# Patient Record
Sex: Female | Born: 1990 | Race: White | Hispanic: No | Smoking: Former smoker
Health system: Southern US, Community
[De-identification: ages and names within clinical notes are randomized; demographics above are authoritative.]

## PROBLEM LIST (undated history)

## (undated) DIAGNOSIS — F909 Attention-deficit hyperactivity disorder, unspecified type: Secondary | ICD-10-CM

## (undated) DIAGNOSIS — R480 Dyslexia and alexia: Secondary | ICD-10-CM

## (undated) DIAGNOSIS — J45909 Unspecified asthma, uncomplicated: Secondary | ICD-10-CM

## (undated) DIAGNOSIS — F419 Anxiety disorder, unspecified: Secondary | ICD-10-CM

## (undated) DIAGNOSIS — J309 Allergic rhinitis, unspecified: Secondary | ICD-10-CM

## (undated) DIAGNOSIS — F339 Major depressive disorder, recurrent, unspecified: Secondary | ICD-10-CM

## (undated) HISTORY — DX: Allergic rhinitis, unspecified: J30.9

## (undated) HISTORY — DX: Major depressive disorder, recurrent, unspecified: F33.9

## (undated) HISTORY — DX: Anxiety disorder, unspecified: F41.9

## (undated) HISTORY — DX: Dyslexia and alexia: R48.0

## (undated) HISTORY — PX: NASAL SINUS SURGERY: SHX719

## (undated) HISTORY — PX: TONSILLECTOMY AND ADENOIDECTOMY: SHX28

## (undated) HISTORY — DX: Attention-deficit hyperactivity disorder, unspecified type: F90.9

---

## 2016-11-08 LAB — CYTOLOGY - PAP: PAP SMEAR: NEGATIVE

## 2017-08-18 ENCOUNTER — Emergency Department (HOSPITAL_COMMUNITY)
Admission: EM | Admit: 2017-08-18 | Discharge: 2017-08-18 | Disposition: A | Discharge status: Home / Self Care | Payer: 59 | Attending: Emergency Medicine | Admitting: Emergency Medicine

## 2017-08-18 ENCOUNTER — Other Ambulatory Visit: Payer: Self-pay

## 2017-08-18 ENCOUNTER — Encounter (HOSPITAL_COMMUNITY): Payer: Self-pay | Admitting: Emergency Medicine

## 2017-08-18 DIAGNOSIS — R309 Painful micturition, unspecified: Secondary | ICD-10-CM | POA: Insufficient documentation

## 2017-08-18 DIAGNOSIS — J45909 Unspecified asthma, uncomplicated: Secondary | ICD-10-CM | POA: Insufficient documentation

## 2017-08-18 DIAGNOSIS — R350 Frequency of micturition: Secondary | ICD-10-CM | POA: Diagnosis present

## 2017-08-18 DIAGNOSIS — R1030 Lower abdominal pain, unspecified: Secondary | ICD-10-CM | POA: Diagnosis not present

## 2017-08-18 DIAGNOSIS — N3000 Acute cystitis without hematuria: Secondary | ICD-10-CM | POA: Diagnosis not present

## 2017-08-18 HISTORY — DX: Unspecified asthma, uncomplicated: J45.909

## 2017-08-18 LAB — URINALYSIS, ROUTINE W REFLEX MICROSCOPIC
Bilirubin Urine: NEGATIVE
GLUCOSE, UA: NEGATIVE mg/dL
KETONES UR: NEGATIVE mg/dL
Nitrite: POSITIVE — AB
PROTEIN: 30 mg/dL — AB
Specific Gravity, Urine: 1.023 (ref 1.005–1.030)
pH: 6 (ref 5.0–8.0)

## 2017-08-18 LAB — PREGNANCY, URINE: PREG TEST UR: NEGATIVE

## 2017-08-18 MED ORDER — CIPROFLOXACIN HCL 500 MG PO TABS
500.0000 mg | ORAL_TABLET | Freq: Once | ORAL | Status: AC
  Administered 2017-08-18: 500 mg via ORAL
  Filled 2017-08-18: qty 1

## 2017-08-18 MED ORDER — PHENAZOPYRIDINE HCL 200 MG PO TABS: 200.0000 mg | ORAL_TABLET | Freq: Three times a day (TID) | ORAL | 0 refills | Status: DC | PRN

## 2017-08-18 MED ORDER — CIPROFLOXACIN HCL 500 MG PO TABS: 500.0000 mg | ORAL_TABLET | Freq: Two times a day (BID) | ORAL | 0 refills | Status: DC

## 2017-08-18 NOTE — ED Provider Notes (Signed)
[facility]-EMERGENCY DEPT Provider Note   CSN: [id number] Arrival date & time: 08/18/17  0114     History   Chief Complaint Chief Complaint  Patient presents with  . Urinary Frequency    HPI Christy Willis is a 27 y.o. female.  Patient is a 27 year old female with no significant past medical history presenting with complaints of burning with urination and suprapubic discomfort for the past several days.  She believes she may have a UTI.  She has had these in the past and this feels the same.  She denies any fevers or chills.  She denies any vomiting.   The history is provided by the patient.  Urinary Frequency  This is a new problem. Episode onset: 3 days ago. The problem occurs constantly. The problem has been gradually worsening. Associated symptoms include abdominal pain. Exacerbated by: Urinating. Nothing relieves the symptoms. Treatments tried: Ibuprofen. The treatment provided mild relief.    Past Medical History:  Diagnosis Date  . Asthma     There are no active problems to display for this patient.   Past Surgical History:  Procedure Laterality Date  . NASAL SINUS SURGERY    . TONSILLECTOMY AND ADENOIDECTOMY      OB History    No data available       Home Medications    Prior to Admission medications   Not on File    Family History Family History  Adopted: Yes    Social History Social History   Tobacco Use  . Smoking status: Never Smoker  . Smokeless tobacco: Never Used  Substance Use Topics  . Alcohol use: No    Frequency: Never  . Drug use: No     Allergies   Patient has no known allergies.   Review of Systems Review of Systems  Gastrointestinal: Positive for abdominal pain.  Genitourinary: Positive for frequency.  All other systems reviewed and are negative.    Physical Exam Updated Vital Signs BP 123/77 (BP Location: Left Arm)   Pulse 82   Temp 98 F (36.7 C) (Oral)   Resp 18   LMP 08/14/2017  (Exact Date)   SpO2 96%   Physical Exam  Constitutional: She is oriented to person, place, and time. She appears well-developed and well-nourished. No distress.  HENT:  Head: Normocephalic and atraumatic.  Neck: Normal range of motion. Neck supple.  Cardiovascular: Normal rate and regular rhythm. Exam reveals no gallop and no friction rub.  No murmur heard. Pulmonary/Chest: Effort normal and breath sounds normal. No respiratory distress. She has no wheezes.  Abdominal: Soft. Bowel sounds are normal. She exhibits no distension. There is tenderness.  There is mild suprapubic tenderness.  There is no rebound or guarding.  Musculoskeletal: Normal range of motion.  Neurological: She is alert and oriented to person, place, and time.  Skin: Skin is warm and dry. She is not diaphoretic.  Nursing note and vitals reviewed.    ED Treatments / Results  Labs (all labs ordered are listed, but only abnormal results are displayed) Labs Reviewed  URINALYSIS, ROUTINE W REFLEX MICROSCOPIC - Abnormal; Notable for the following components:      Result Value   APPearance CLOUDY (*)    Hgb urine dipstick SMALL (*)    Protein, ur 30 (*)    Nitrite POSITIVE (*)    Leukocytes, UA LARGE (*)    Bacteria, UA MANY (*)    Squamous Epithelial / LPF 6-30 (*)    Non Squamous  Epithelial 0-5 (*)    All other components within normal limits  PREGNANCY, URINE    EKG  EKG Interpretation None       Radiology No results found.  Procedures Procedures (including critical care time)  Medications Ordered in ED Medications  ciprofloxacin (CIPRO) tablet 500 mg (not administered)     Initial Impression / Assessment and Plan / ED Course  I have reviewed the triage vital signs and the nursing notes.  Pertinent labs & imaging results that were available during my care of the patient were reviewed by me and considered in my medical decision making (see chart for details).  Patient presents with urinary  symptoms and a urinalysis that is consistent with a UTI.  She will be treated with Cipro, Pyridium, and follow-up as needed if not improving.  She has no fever and vitals are stable.  She does not appear toxic.  Final Clinical Impressions(s) / ED Diagnoses   Final diagnoses:  None    ED Discharge Orders    None       Geoffery Lyonselo, Takeysha Bonk, MD 08/18/17 [phone]

## 2017-08-18 NOTE — ED Triage Notes (Signed)
Pt states she thinks she has a UTI  Pt states she has urinary frequency, lower abd and back pain, nausea, cloudy urine, and decreased appetite  Pt states she is not able to provide a urine specimen at this time

## 2017-08-18 NOTE — Discharge Instructions (Signed)
Cipro as prescribed.  Pyridium as prescribed.  Ibuprofen 600 mg rotated with Tylenol 1000 mg every 4 hours as needed for pain.  Return to the emergency department for high fever, vomiting, severe pain, or other new and concerning symptoms.

## 2017-09-23 ENCOUNTER — Encounter (HOSPITAL_COMMUNITY): Payer: Self-pay | Admitting: Emergency Medicine

## 2017-09-23 ENCOUNTER — Other Ambulatory Visit: Payer: Self-pay

## 2017-09-23 ENCOUNTER — Encounter (HOSPITAL_BASED_OUTPATIENT_CLINIC_OR_DEPARTMENT_OTHER): Payer: Self-pay | Admitting: Emergency Medicine

## 2017-09-23 ENCOUNTER — Emergency Department (HOSPITAL_COMMUNITY)
Admission: EM | Admit: 2017-09-23 | Discharge: 2017-09-23 | Disposition: A | Discharge status: Home / Self Care | Payer: 59 | Source: Home / Self Care

## 2017-09-23 ENCOUNTER — Emergency Department (HOSPITAL_BASED_OUTPATIENT_CLINIC_OR_DEPARTMENT_OTHER)
Admission: EM | Admit: 2017-09-23 | Discharge: 2017-09-23 | Disposition: A | Discharge status: Home / Self Care | Payer: 59 | Attending: Emergency Medicine | Admitting: Emergency Medicine

## 2017-09-23 DIAGNOSIS — Z79899 Other long term (current) drug therapy: Secondary | ICD-10-CM | POA: Diagnosis not present

## 2017-09-23 DIAGNOSIS — N898 Other specified noninflammatory disorders of vagina: Secondary | ICD-10-CM | POA: Diagnosis not present

## 2017-09-23 DIAGNOSIS — A6004 Herpesviral vulvovaginitis: Secondary | ICD-10-CM | POA: Diagnosis not present

## 2017-09-23 DIAGNOSIS — R509 Fever, unspecified: Secondary | ICD-10-CM

## 2017-09-23 DIAGNOSIS — J45909 Unspecified asthma, uncomplicated: Secondary | ICD-10-CM | POA: Diagnosis not present

## 2017-09-23 DIAGNOSIS — R300 Dysuria: Secondary | ICD-10-CM | POA: Diagnosis present

## 2017-09-23 DIAGNOSIS — Z5321 Procedure and treatment not carried out due to patient leaving prior to being seen by health care provider: Secondary | ICD-10-CM

## 2017-09-23 LAB — URINALYSIS, COMPLETE (UACMP) WITH MICROSCOPIC
Bilirubin Urine: NEGATIVE
Glucose, UA: NEGATIVE mg/dL
KETONES UR: NEGATIVE mg/dL
Nitrite: POSITIVE — AB
PROTEIN: 30 mg/dL — AB
Specific Gravity, Urine: 1.015 (ref 1.005–1.030)
pH: 6 (ref 5.0–8.0)

## 2017-09-23 LAB — WET PREP, GENITAL
CLUE CELLS WET PREP: NONE SEEN
Sperm: NONE SEEN
TRICH WET PREP: NONE SEEN
YEAST WET PREP: NONE SEEN

## 2017-09-23 LAB — PREGNANCY, URINE: Preg Test, Ur: NEGATIVE

## 2017-09-23 MED ORDER — IBUPROFEN 800 MG PO TABS
800.0000 mg | ORAL_TABLET | Freq: Once | ORAL | Status: AC
  Administered 2017-09-23: 800 mg via ORAL
  Filled 2017-09-23: qty 1

## 2017-09-23 MED ORDER — VALACYCLOVIR HCL 1 G PO TABS: 1000.0000 mg | ORAL_TABLET | Freq: Two times a day (BID) | ORAL | 0 refills | Status: AC

## 2017-09-23 MED ORDER — AZITHROMYCIN 250 MG PO TABS
1000.0000 mg | ORAL_TABLET | Freq: Once | ORAL | Status: AC
Start: 2017-09-23 — End: 2017-09-23
  Administered 2017-09-23: 1000 mg via ORAL
  Filled 2017-09-23: qty 4

## 2017-09-23 MED ORDER — IBUPROFEN 800 MG PO TABS: 800.0000 mg | ORAL_TABLET | Freq: Three times a day (TID) | ORAL | 0 refills | Status: DC

## 2017-09-23 MED ORDER — METRONIDAZOLE 500 MG PO TABS: 500.0000 mg | ORAL_TABLET | Freq: Two times a day (BID) | ORAL | 0 refills | Status: DC

## 2017-09-23 MED ORDER — CEFTRIAXONE SODIUM 250 MG IJ SOLR
250.0000 mg | Freq: Once | INTRAMUSCULAR | Status: AC
  Administered 2017-09-23: 250 mg via INTRAMUSCULAR
  Filled 2017-09-23: qty 250

## 2017-09-23 MED ORDER — TRAMADOL HCL 50 MG PO TABS: 100.0000 mg | ORAL_TABLET | Freq: Four times a day (QID) | ORAL | 0 refills | Status: DC | PRN

## 2017-09-23 NOTE — ED Provider Notes (Signed)
[facility] EMERGENCY DEPARTMENT Provider Note   CSN: [id number] Arrival date & time: 09/23/17  4098     History   Chief Complaint Chief Complaint  Patient presents with  . Dysuria  . Vaginal Discharge    HPI Christy Willis is a 27 y.o. female.  HPI Patient reports burning with urination starting 3 days ago.  She first noted vaginal burning and discomfort and thought she might have yeast infection.  She tried Diflucan without any improvement.  Patient reports she took antibiotics about a month ago for urinary tract infection.  She reports since then she has had increased swelling and vaginal irritation and burning.  He denies any significant amount of abdominal pain.  She reports last night she felt that she had a fever.  Patient is sexually active.  She has been with this partner for 2 months.  She denies history of STD.  No other medical history except controlled asthma. Past Medical History:  Diagnosis Date  . Asthma     There are no active problems to display for this patient.   Past Surgical History:  Procedure Laterality Date  . NASAL SINUS SURGERY    . TONSILLECTOMY AND ADENOIDECTOMY      OB History    No data available       Home Medications    Prior to Admission medications   Medication Sig Start Date End Date Taking? Authorizing Provider  propantheline (PROBANTHINE) 15 MG tablet Take 15 mg by mouth 3 (three) times daily with meals.   Yes [provider]  QUEtiapine (SEROQUEL) 100 MG tablet Take 100 mg by mouth at bedtime.   Yes [provider]  sertraline (ZOLOFT) 100 MG tablet Take 100 mg by mouth daily.   Yes [provider]  ciprofloxacin (CIPRO) 500 MG tablet Take 1 tablet (500 mg total) by mouth 2 (two) times daily. One po bid x 7 days 08/18/17   Geoffery Lyons, MD  ibuprofen (ADVIL,MOTRIN) 800 MG tablet Take 1 tablet (800 mg total) by mouth 3 (three) times daily. 09/23/17   Arby Barrette, MD  metroNIDAZOLE (FLAGYL)  500 MG tablet Take 1 tablet (500 mg total) by mouth 2 (two) times daily. One po bid x 7 days 09/23/17   Arby Barrette, MD  phenazopyridine (PYRIDIUM) 200 MG tablet Take 1 tablet (200 mg total) by mouth 3 (three) times daily as needed for pain. 08/18/17   Geoffery Lyons, MD  traMADol (ULTRAM) 50 MG tablet Take 2 tablets (100 mg total) by mouth every 6 (six) hours as needed. 09/23/17   Arby Barrette, MD  valACYclovir (VALTREX) 1000 MG tablet Take 1 tablet (1,000 mg total) by mouth 2 (two) times daily for 10 days. 09/23/17 10/03/17  Arby Barrette, MD    Family History Family History  Adopted: Yes    Social History Social History   Tobacco Use  . Smoking status: Never Smoker  . Smokeless tobacco: Never Used  Substance Use Topics  . Alcohol use: No    Frequency: Never  . Drug use: No     Allergies   Patient has no known allergies.   Review of Systems Review of Systems 10 Systems reviewed and are negative for acute change except as noted in the HPI.  Physical Exam Updated Vital Signs BP 104/63 (BP Location: Left Arm)   Pulse 92   Temp 98.8 F (37.1 C) (Oral)   Resp 18   Ht 5\' 4"  (1.626 m)   Wt 57.6 kg (127  lb)   LMP 09/02/2017   SpO2 98%   BMI 21.80 kg/m   Physical Exam  Constitutional: She is oriented to person, place, and time. She appears well-developed and well-nourished. No distress.  HENT:  Head: Normocephalic and atraumatic.  Eyes: Conjunctivae and EOM are normal.  Cardiovascular: Normal rate, regular rhythm and normal heart sounds.  Pulmonary/Chest: Effort normal and breath sounds normal.  Abdominal: Soft.  Mild suprapubic discomfort to palpation.  She reports pressure sensation.  Moderate increased discomfort left lower/mid abdomen.  No guarding, no palpable mass.  Genitourinary:  Genitourinary Comments: Shallow erosive lesions on inner mucosal surfaces of labia minora.  These are exquisitely tender.  Speculum exam shows copious yellow discharge in the  vaginal vault and friability of the cervix.  Bimanual exam, cervix tender but adnexa nontender.  Musculoskeletal: Normal range of motion. She exhibits no edema or tenderness.  No joint effusions.  Neurological: She is alert and oriented to person, place, and time. No cranial nerve deficit. She exhibits normal muscle tone. Coordination normal.  Skin: Skin is warm and dry.  Psychiatric: She has a normal mood and affect.     ED Treatments / Results  Labs (all labs ordered are listed, but only abnormal results are displayed) Labs Reviewed  URINALYSIS, COMPLETE (UACMP) WITH MICROSCOPIC - Abnormal; Notable for the following components:      Result Value   APPearance CLOUDY (*)    Hgb urine dipstick MODERATE (*)    Protein, ur 30 (*)    Nitrite POSITIVE (*)    Leukocytes, UA LARGE (*)    Squamous Epithelial / LPF TOO NUMEROUS TO COUNT (*)    Bacteria, UA MANY (*)    All other components within normal limits  WET PREP, GENITAL  HSV CULTURE AND TYPING  PREGNANCY, URINE  RPR  HIV ANTIBODY (ROUTINE TESTING)  GC/CHLAMYDIA PROBE AMP ([place]) NOT AT [facility]    EKG  EKG Interpretation None       Radiology No results found.  Procedures Procedures (including critical care time)  Medications Ordered in ED Medications  cefTRIAXone (ROCEPHIN) injection 250 mg (not administered)  azithromycin (ZITHROMAX) tablet 1,000 mg (not administered)  ibuprofen (ADVIL,MOTRIN) tablet 800 mg (not administered)     Initial Impression / Assessment and Plan / ED Course  I have reviewed the triage vital signs and the nursing notes.  Pertinent labs & imaging results that were available during my care of the patient were reviewed by me and considered in my medical decision making (see chart for details).      Final Clinical Impressions(s) / ED Diagnoses   Final diagnoses:  Herpes simplex vulvovaginitis  Vaginal discharge   Patient presents with new onset of vaginal lesions consistent with  herpes simplex.  Also she has had vaginal discharge.  Will treat for broad coverage of STDs.  Patient will be started on Valtrex.  She is counseled on necessity of follow-up with gynecology.  Information given for women's clinic.  She is counseled on avoiding any sexual contact until complete treatment and treatment of partners. ED Discharge Orders        Ordered    valACYclovir (VALTREX) 1000 MG tablet  2 times daily     09/23/17 0945    metroNIDAZOLE (FLAGYL) 500 MG tablet  2 times daily     09/23/17 0945    ibuprofen (ADVIL,MOTRIN) 800 MG tablet  3 times daily     09/23/17 0945    traMADol (ULTRAM) 50 MG tablet  Every 6 hours PRN     09/23/17 0945       Arby BarrettePfeiffer, Mazal Ebey, MD 09/23/17 [phone]

## 2017-09-23 NOTE — ED Notes (Signed)
Bladder scan showed 18ml

## 2017-09-23 NOTE — ED Triage Notes (Signed)
Patient reports since last night not able to pee.  Tried taking Diflucan because has at home and not helped.  Patient also states that she has vaginal pain and very sensitive. Patient reports that she has fever last night and took some medicatins around 530am today.

## 2017-09-23 NOTE — Discharge Instructions (Signed)
1.  Make an appointment to follow-up with a gynecologist.  You may contact a gynecologist of your choice or [facility]. 2.  You must avoid all sexual contact until you have completed treatment and any partners have been treated.  Repeat exposure to infected partners will result in reinfection.

## 2017-09-23 NOTE — ED Triage Notes (Signed)
Dysuria, vaginal discharge, vaginal soreness x 2 days.

## 2017-09-24 LAB — RPR: RPR Ser Ql: NONREACTIVE

## 2017-09-24 LAB — HIV ANTIBODY (ROUTINE TESTING W REFLEX): HIV Screen 4th Generation wRfx: NONREACTIVE

## 2017-09-25 LAB — HSV CULTURE AND TYPING

## 2017-09-25 LAB — GC/CHLAMYDIA PROBE AMP (~~LOC~~) NOT AT ARMC
CHLAMYDIA, DNA PROBE: NEGATIVE
NEISSERIA GONORRHEA: NEGATIVE

## 2017-10-03 ENCOUNTER — Encounter: Payer: Self-pay | Admitting: Obstetrics and Gynecology

## 2017-10-03 ENCOUNTER — Ambulatory Visit (INDEPENDENT_AMBULATORY_CARE_PROVIDER_SITE_OTHER): Payer: 59 | Admitting: Obstetrics and Gynecology

## 2017-10-03 ENCOUNTER — Ambulatory Visit: Payer: 59 | Admitting: Clinical

## 2017-10-03 VITALS — BP 132/88 | HR 88 | Ht 64.0 in | Wt 135.7 lb

## 2017-10-03 DIAGNOSIS — F4323 Adjustment disorder with mixed anxiety and depressed mood: Secondary | ICD-10-CM

## 2017-10-03 DIAGNOSIS — F329 Major depressive disorder, single episode, unspecified: Secondary | ICD-10-CM

## 2017-10-03 DIAGNOSIS — G2581 Restless legs syndrome: Secondary | ICD-10-CM

## 2017-10-03 DIAGNOSIS — R4589 Other symptoms and signs involving emotional state: Secondary | ICD-10-CM

## 2017-10-03 DIAGNOSIS — B009 Herpesviral infection, unspecified: Secondary | ICD-10-CM

## 2017-10-03 DIAGNOSIS — Z09 Encounter for follow-up examination after completed treatment for conditions other than malignant neoplasm: Secondary | ICD-10-CM

## 2017-10-03 MED ORDER — GABAPENTIN 300 MG PO CAPS: 300.0000 mg | ORAL_CAPSULE | Freq: Every day | ORAL | 0 refills | Status: DC

## 2017-10-03 MED ORDER — VALACYCLOVIR HCL 500 MG PO TABS: 500.0000 mg | ORAL_TABLET | Freq: Two times a day (BID) | ORAL | 3 refills | Status: AC

## 2017-10-03 NOTE — BH Specialist Note (Signed)
Integrated Behavioral Health Initial Visit  Seen by Alfredo BachAllison Workman, MSW Intern MRN: [id number] Name: Christy PileAnna Willis  Number of Integrated Behavioral Health Clinician visits:: 1/6 Session Start time: 11:07am  Session End time: 11:24am Total time: 20 minutes  Type of Service: Integrated Behavioral Health- Individual/Family Interpretor:No. Interpretor Name and Language: English   Warm Hand Off Completed.       SUBJECTIVE: Christy Willis is a 27 y.o. female accompanied by N/A Patient was referred by Victorino DikeJennifer Rash NP for Depression and Anxiety Patient reports the following symptoms/concerns: Pt. Is dealing with depression and anxiety due to current diagnosis and recent divorce. Pt. Is new to the area and is trying to adjust to new city and new job. Pt. Stated she is looking for an therapist that specializes in working with children of adoption. Duration of problem: Over two months; Severity of problem: moderate  OBJECTIVE: Mood: Anxious and Depressed and Affect: Depressed and Tearful Risk of harm to self or others: No plan to harm self or others  LIFE CONTEXT: Family and Social: Pt. Friends and family are supportive; currently going through a divorce. School/Work: She works part time at [employer] temporarily.  Self-Care: no substances, advocating for herself by prioritizing herself and wellbeing.  Life Changes: Going through divorce, new job, new residence, STD diagnosis.  GOALS ADDRESSED: Patient will: 1. Reduce symptoms of: anxiety, depression and stress 2. Increase knowledge and/or ability of: self-management skills and stress reduction  3. Demonstrate ability to: Increase healthy adjustment to current life circumstances  INTERVENTIONS: Interventions utilized: Supportive Counseling, Psychoeducation and/or Health Education and Link to [facility]  Standardized Assessments completed: GAD-7 and PHQ 9  ASSESSMENT: Patient currently experiencing Adjustment disorder with  mixed anxious and depressed mood..   Patient may benefit from psychoeducation and brief therapeutic interventions regarding coping with symptoms of depression and anxiety. Marland Kitchen.  PLAN: 1. Follow up with behavioral health clinician on : as needed 2. Behavioral recommendations:  -[facility] for potential therapy adoption specialist. -Consider self coping and relaxation for upcoming weekend. -Consider apps for additional self care. -Consider Women's resource center; additional support for women going through divorce. 3. Referral(s): Integrated [facility]al Health Services (In Clinic), Community Resources:  womens resource center and Counselor 4. "From scale of 1-10, how likely are you to follow plan?": 8   Rae LipsJamie C Isidra Mings, LCSW   Depression screen [facility] 2/9 10/03/2017  Decreased Interest 3  Down, Depressed, Hopeless 0  PHQ - 2 Score 3  Altered sleeping 2  Tired, decreased energy 3  Change in appetite 0  Feeling bad or failure about yourself  2  Trouble concentrating 0  Moving slowly or fidgety/restless 1  Suicidal thoughts 0  PHQ-9 Score 11   GAD 7 : Generalized Anxiety Score 10/03/2017  Nervous, Anxious, on Edge 2  Control/stop worrying 2  Worry too much - different things 2  Trouble relaxing 2  Restless 2  Easily annoyed or irritable 3  Afraid - awful might happen 1  Total GAD 7 Score 14

## 2017-10-03 NOTE — Progress Notes (Signed)
Subjective:   Christy Willis is a 27 y.o. Non pregnant  female here a follow up ED visit.   Current complaints: depression and recent HSV outbreak.   Denies abnormal vaginal bleeding, discharge, pelvic pain, problems with intercourse. [facility] Says the HSV outbreak has improved since taking the valtrex.    Gynecologic History Patient's last menstrual period was 09/22/2017. Contraception: OCP (estrogen/progesterone) Last Pap: Last year. Results were: abnormal, colpo was done and it was normal. [facility]  Last mammogram: NA.   Obstetric History OB History  No data available    Past Medical History:  Diagnosis Date  . Asthma     Past Surgical History:  Procedure Laterality Date  . NASAL SINUS SURGERY    . TONSILLECTOMY AND ADENOIDECTOMY      Current Outpatient Medications on File Prior to Visit  Medication Sig Dispense Refill  . ibuprofen (ADVIL,MOTRIN) 800 MG tablet Take 1 tablet (800 mg total) by mouth 3 (three) times daily. 21 tablet 0  . QUEtiapine (SEROQUEL) 100 MG tablet Take 100 mg by mouth at bedtime.    . sertraline (ZOLOFT) 100 MG tablet Take 100 mg by mouth daily.    . ciprofloxacin (CIPRO) 500 MG tablet Take 1 tablet (500 mg total) by mouth 2 (two) times daily. One po bid x 7 days (Patient not taking: Reported on 10/03/2017) 14 tablet 0  . metroNIDAZOLE (FLAGYL) 500 MG tablet Take 1 tablet (500 mg total) by mouth 2 (two) times daily. One po bid x 7 days (Patient not taking: Reported on 10/03/2017) 14 tablet 0  . phenazopyridine (PYRIDIUM) 200 MG tablet Take 1 tablet (200 mg total) by mouth 3 (three) times daily as needed for pain. (Patient not taking: Reported on 10/03/2017) 6 tablet 0  . propantheline (PROBANTHINE) 15 MG tablet Take 15 mg by mouth 3 (three) times daily with meals.    . traMADol (ULTRAM) 50 MG tablet Take 2 tablets (100 mg total) by mouth every 6 (six) hours as needed. (Patient not taking: Reported on 10/03/2017)  20 tablet 0  . valACYclovir (VALTREX) 1000 MG tablet Take 1 tablet (1,000 mg total) by mouth 2 (two) times daily for 10 days. (Patient not taking: Reported on 10/03/2017) 20 tablet 0   No current facility-administered medications on file prior to visit.     No Known Allergies  Social History   Socioeconomic History  . Marital status: Legally Separated    Spouse name: Not on file  . Number of children: Not on file  . Years of education: Not on file  . Highest education level: Not on file  Social Needs  . Financial resource strain: Not on file  . Food insecurity - worry: Not on file  . Food insecurity - inability: Not on file  . Transportation needs - medical: Not on file  . Transportation needs - non-medical: Not on file  Occupational History  . Not on file  Tobacco Use  . Smoking status: Never Smoker  . Smokeless tobacco: Never Used  Substance and Sexual Activity  . Alcohol use: No    Frequency: Never  . Drug use: No  . Sexual activity: Not on file  Other Topics Concern  . Not on file  Social History Narrative  . Not on file    Family History  Adopted: Yes    The following portions of the patient's history were reviewed and updated as appropriate: allergies, current medications,  past family history, past medical history, past social history, past surgical history and problem list.  Review of Systems Pertinent items noted in HPI and remainder of comprehensive ROS otherwise negative.   Objective:  BP 132/88   Pulse 88   Ht 5\' 4"  (1.626 m)   Wt 135 lb 11.2 oz (61.6 kg)   LMP 09/22/2017   BMI 23.29 kg/m  CONSTITUTIONAL: Well-developed, well-nourished female in no acute distress.  HENT:  Normocephalic, atraumatic, External right and left ear normal. Oropharynx is clear and moist EYES: Conjunctivae and EOM are normal. Pupils are equal, round, and reactive to light. No scleral icterus.  NECK: Normal range of motion, supple, no masses.  Normal thyroid.  SKIN: Skin is  warm and dry. No rash noted. Not diaphoretic. No erythema. No pallor. NEUROLOGIC: Alert and oriented to person, place, and time. Normal reflexes, muscle tone coordination. No cranial nerve deficit noted. PSYCHIATRIC: Normal mood and affect. Normal behavior. Normal judgment and thought content. Depressed mood.  CARDIOVASCULAR: Normal heart rate noted, regular rhythm RESPIRATORY: Clear to auscultation bilaterally. Effort and breath sounds normal, no problems with respiration noted. MUSCULOSKELETAL: Normal range of motion. No tenderness.  No cyanosis, clubbing, or edema.  2+ distal pulses.   Assessment and Plan:   1. Follow-up exam after treatment  Rx: Valtrex  Request records from previous GYN.  Follow up as needed, for annual exam   2. HSV (herpes simplex virus) infection  Doing better   3. Restless leg  Rx: Gabapentin    4. Depressed mood  Patient to see Asher Muir today.  Continue Zoloft.    Routine preventative health maintenance measures emphasized. Please refer to After Visit Summary for other counseling recommendations.    Rasch, Harolyn Rutherford, NP [facility] for [employer], [facility]

## 2017-10-10 ENCOUNTER — Telehealth: Payer: Self-pay | Admitting: General Practice

## 2017-10-10 NOTE — Telephone Encounter (Signed)
Left HIPPA-compliant message to call [facility] at [facility] at [phone].

## 2017-10-10 NOTE — Telephone Encounter (Signed)
l °

## 2017-10-27 ENCOUNTER — Telehealth: Payer: Self-pay | Admitting: General Practice

## 2017-10-27 NOTE — Telephone Encounter (Signed)
Patient called and left message on voicemail line stating she needs a refill on her gabapentin. Patient states her PCP appt isn't until 4/16 and the prescription will not last long enough. Called patient and reviewed dose instructions and she should have been given 90 pills which will last until her appt. Patient verbalized understanding & states she was only given 60. Told patient I will call the pharmacy and figure out what happened. Called the pharmacy who states the patient was given 60 on 3/5 that she paid cash for because insurance wasn't covering because it was filled too closely to the last prescription. The pharmacy states the patient came by and picked up the remaining 30 on 3/22. Called the patient, no answer- left message on voicemail stating we are calling regarding her prescription & the pharmacy informed us she has all her medication now. She may call us back if she has questions.

## 2017-11-01 ENCOUNTER — Encounter: Payer: Self-pay | Admitting: *Deleted

## 2017-11-15 DIAGNOSIS — F1020 Alcohol dependence, uncomplicated: Secondary | ICD-10-CM | POA: Insufficient documentation

## 2017-11-15 DIAGNOSIS — M792 Neuralgia and neuritis, unspecified: Secondary | ICD-10-CM | POA: Insufficient documentation

## 2017-11-15 DIAGNOSIS — F1920 Other psychoactive substance dependence, uncomplicated: Secondary | ICD-10-CM | POA: Insufficient documentation

## 2017-11-15 DIAGNOSIS — J302 Other seasonal allergic rhinitis: Secondary | ICD-10-CM | POA: Insufficient documentation

## 2017-11-15 DIAGNOSIS — F1921 Other psychoactive substance dependence, in remission: Secondary | ICD-10-CM | POA: Insufficient documentation

## 2017-12-26 DIAGNOSIS — J4540 Moderate persistent asthma, uncomplicated: Secondary | ICD-10-CM | POA: Insufficient documentation

## 2018-01-22 ENCOUNTER — Telehealth: Payer: Self-pay | Admitting: General Practice

## 2018-01-22 DIAGNOSIS — B009 Herpesviral infection, unspecified: Secondary | ICD-10-CM

## 2018-01-22 MED ORDER — VALACYCLOVIR HCL 1 G PO TABS: 1000.0000 mg | ORAL_TABLET | Freq: Every day | ORAL | 11 refills | Status: DC

## 2018-01-22 NOTE — Telephone Encounter (Signed)
Patient called and left message on nurse voicemail line stating she was diagnosed with HSV back in March and has had 7 outbreaks since then. Patient is calling to see if there is something stronger that she can take on daily basis to minimize these outbreaks.  Per Thressa ShellerHeather Hogan, may prescribe valtrex 1 gram daily #30 with 11 refills. Called patient, no answer- left message stating we are trying to reach you to return your phone call. We have received your voicemail message and have sent in the prescription you requested to your pharmacy. You may call us back if you have questions.

## 2018-01-23 ENCOUNTER — Telehealth: Payer: Self-pay | Admitting: *Deleted

## 2018-01-23 NOTE — Telephone Encounter (Signed)
Tobi Bastosnna called today and left a message she called yesterday about getting daily dose of medicine to stop hsv outbreaks and got message was at her pharmacy; but states it wasn't there. I called Lakie back and verfied which pharmacy she was at. I informed her I would call the pharmacy and verify they got order and she should follow up with them in  A few hours. I called pharmacy and they received order and have rx ready.

## 2018-04-19 ENCOUNTER — Telehealth: Payer: Self-pay | Admitting: *Deleted

## 2018-04-19 DIAGNOSIS — N760 Acute vaginitis: Principal | ICD-10-CM

## 2018-04-19 DIAGNOSIS — B9689 Other specified bacterial agents as the cause of diseases classified elsewhere: Secondary | ICD-10-CM

## 2018-04-19 MED ORDER — METRONIDAZOLE 500 MG PO TABS: 500.0000 mg | ORAL_TABLET | Freq: Two times a day (BID) | ORAL | 0 refills | Status: DC

## 2018-04-19 NOTE — Telephone Encounter (Signed)
Christy Willis left a voicemail this am c/o she thinks she has BV again because she has a bad vaginal odor and yellow discharge and wants to know if we can send in RX or does she have to come in.  Per review treated in February. I called Christy Willis and we discussed her symptoms and I informed her I can send in the rx and if this does not relieve her symptoms she can come in to do a self swab to check for other issues. She states she prefers I send in rx and she will call back and come in if her symptoms are not relieved. RX sent in to pharmacy of her choice.

## 2018-05-08 ENCOUNTER — Telehealth: Payer: Self-pay | Admitting: General Practice

## 2018-05-08 DIAGNOSIS — A6000 Herpesviral infection of urogenital system, unspecified: Secondary | ICD-10-CM

## 2018-05-08 MED ORDER — LIDOCAINE 5 % EX OINT: 1.0000 "application " | TOPICAL_OINTMENT | CUTANEOUS | 0 refills | Status: DC | PRN

## 2018-05-08 NOTE — Telephone Encounter (Signed)
Patient called & left message on nurse voicemail line stating she is having a horrible herpes outbreak and is taking valtrex & Rx strength ibuprofen. Patient is requesting Rx for tramadol. Per Luna Kitchens, may send in Rx for lidocaine ointment but not tramadol. Called & informed patient. Patient verbalized understanding and had no questions.

## 2018-10-24 ENCOUNTER — Telehealth: Payer: Self-pay

## 2018-10-24 MED ORDER — METRONIDAZOLE 500 MG PO TABS: 500.0000 mg | ORAL_TABLET | Freq: Two times a day (BID) | ORAL | 0 refills | Status: DC

## 2018-10-24 NOTE — Telephone Encounter (Signed)
Pt called stating that she has BV and needs a prescription. Notified pt that a prescription of Flagyl has been sent to her [facility] pharmacy on [address]  Rock Nephew stated thank you.

## 2018-10-25 ENCOUNTER — Telehealth: Payer: Self-pay | Admitting: *Deleted

## 2018-10-25 NOTE — Telephone Encounter (Signed)
Christy Willis called today and left a message she called yesterday and was told an antibiotic was called in to [facility] on [address] for BV but it didn't go thru.

## 2018-10-26 NOTE — Telephone Encounter (Signed)
LVM advising pt that I called the [facility] to be sure that the Rx was sent on the 3/25 the pharmacy verified delivery and stated the patient didn't pick up told pt on VM to go get the medication I called and verified it was there.

## 2019-06-13 ENCOUNTER — Telehealth (INDEPENDENT_AMBULATORY_CARE_PROVIDER_SITE_OTHER): Payer: 59 | Admitting: Lactation Services

## 2019-06-13 DIAGNOSIS — B373 Candidiasis of vulva and vagina: Secondary | ICD-10-CM

## 2019-06-13 DIAGNOSIS — B3731 Acute candidiasis of vulva and vagina: Secondary | ICD-10-CM

## 2019-06-13 MED ORDER — FLUCONAZOLE 150 MG PO TABS: 150.0000 mg | ORAL_TABLET | Freq: Once | ORAL | 0 refills | Status: AC

## 2019-06-13 NOTE — Telephone Encounter (Signed)
Pt called in to the office to report she has a yeast infection.   Called pt and she reports she has vaginal itching and white discharge. She reports she usually uses the pill and has not had one in a while.   Pt denies pregnancy at this time and denies any medication allergies.   Pt reports she has an appt on December 10 for her yearly exam.   Prescription sent to pts. Pharmacy.

## 2019-06-18 ENCOUNTER — Telehealth: Payer: Self-pay | Admitting: General Practice

## 2019-06-18 NOTE — Telephone Encounter (Signed)
Patient called and left message on nurse voicemail line stating she was recently treated for a yeast infection and thinks she has a UTI now. Patient reports taking some leftover cipro she had at home for a couple days now but it hasn't helped.   Called patient and asked about symptoms. She reports dysuria, incomplete emptying, frequency, as well as cloud malodorous urine for a couple days now. Patient reports she has been taking leftover cipro and azo but it hasn't helped. Offered nurse visit appt tomorrow at 8:30. Discussed to stop taking cipro and throw the Rx away. Told patient if it looks like she does have an infection when she comes in we can send in an antibiotic while waiting for the culture to come back. Encouraged her to drink lots of water between now & then. Patient verbalized understanding & had no questions.

## 2019-06-19 ENCOUNTER — Ambulatory Visit (INDEPENDENT_AMBULATORY_CARE_PROVIDER_SITE_OTHER): Payer: Managed Care, Other (non HMO)

## 2019-06-19 ENCOUNTER — Other Ambulatory Visit: Payer: Self-pay

## 2019-06-19 DIAGNOSIS — R309 Painful micturition, unspecified: Secondary | ICD-10-CM

## 2019-06-19 DIAGNOSIS — R350 Frequency of micturition: Secondary | ICD-10-CM

## 2019-06-19 LAB — POCT URINALYSIS DIP (DEVICE)
Bilirubin Urine: NEGATIVE
Glucose, UA: 250 mg/dL — AB
Hgb urine dipstick: NEGATIVE
Ketones, ur: NEGATIVE mg/dL
Nitrite: POSITIVE — AB
Protein, ur: 30 mg/dL — AB
Specific Gravity, Urine: 1.02 (ref 1.005–1.030)
Urobilinogen, UA: 1 mg/dL (ref 0.0–1.0)
pH: 5 (ref 5.0–8.0)

## 2019-06-19 MED ORDER — NITROFURANTOIN MONOHYD MACRO 100 MG PO CAPS: 100.0000 mg | ORAL_CAPSULE | Freq: Two times a day (BID) | ORAL | 0 refills | Status: DC

## 2019-06-19 NOTE — Progress Notes (Signed)
Patient seen and assessed by nursing staff during this encounter. I have reviewed the chart and agree with the documentation and plan.  Mora Bellman, MD 06/19/2019 10:26 AM

## 2019-06-19 NOTE — Progress Notes (Signed)
Pt here today for possible UTI. Urinalysis results suspicious for UTI. Pt reporting frequency and pain with urination that began 06/16/19. States she has taken 5 doses of 500 mg Cipro from previous rx; advised pt to not take any rx meds that are not current. Pt reports taking otc phenazopyridine 95 mg TID.   Reviewed with P. Constant, MD who gives verbal order for Macrobid 100 mg BID for 5 days, advises pt continue otc phenazopyridine, and requests urine be sent for culture.   Rx sent to patient's preferred pharmacy. Explained provider's recommendations to patient who verbalizes understanding. Informed pt we will call with urine culture results if there is any change in plan for treatment.   Apolonio Schneiders RN 06/19/19

## 2019-06-21 LAB — URINE CULTURE

## 2019-06-26 ENCOUNTER — Telehealth: Payer: Self-pay

## 2019-06-26 MED ORDER — FLUCONAZOLE 150 MG PO TABS: 150.0000 mg | ORAL_TABLET | Freq: Once | ORAL | 0 refills | Status: AC

## 2019-06-26 NOTE — Telephone Encounter (Addendum)
Pt called and stated that she gave a urine sample and wanted to know results.  Pt notified that her results are normal.

## 2019-07-01 ENCOUNTER — Other Ambulatory Visit: Payer: Self-pay

## 2019-07-01 DIAGNOSIS — Z20822 Contact with and (suspected) exposure to covid-19: Secondary | ICD-10-CM

## 2019-07-04 LAB — NOVEL CORONAVIRUS, NAA: SARS-CoV-2, NAA: NOT DETECTED

## 2019-07-11 ENCOUNTER — Encounter: Payer: Self-pay | Admitting: Obstetrics and Gynecology

## 2019-07-11 ENCOUNTER — Other Ambulatory Visit: Payer: Self-pay

## 2019-07-11 ENCOUNTER — Ambulatory Visit (INDEPENDENT_AMBULATORY_CARE_PROVIDER_SITE_OTHER): Payer: Managed Care, Other (non HMO) | Admitting: Obstetrics and Gynecology

## 2019-07-11 VITALS — BP 127/78 | HR 124 | Wt 148.0 lb

## 2019-07-11 DIAGNOSIS — Z124 Encounter for screening for malignant neoplasm of cervix: Secondary | ICD-10-CM | POA: Diagnosis not present

## 2019-07-11 DIAGNOSIS — Z113 Encounter for screening for infections with a predominantly sexual mode of transmission: Secondary | ICD-10-CM | POA: Diagnosis not present

## 2019-07-11 DIAGNOSIS — Z01419 Encounter for gynecological examination (general) (routine) without abnormal findings: Secondary | ICD-10-CM | POA: Diagnosis not present

## 2019-07-11 DIAGNOSIS — B009 Herpesviral infection, unspecified: Secondary | ICD-10-CM | POA: Insufficient documentation

## 2019-07-11 DIAGNOSIS — N898 Other specified noninflammatory disorders of vagina: Secondary | ICD-10-CM

## 2019-07-11 DIAGNOSIS — Z1151 Encounter for screening for human papillomavirus (HPV): Secondary | ICD-10-CM

## 2019-07-11 MED ORDER — NORETHINDRONE ACET-ETHINYL EST 1-20 MG-MCG PO TABS: 1.0000 | ORAL_TABLET | Freq: Every day | ORAL | 11 refills | Status: DC

## 2019-07-11 MED ORDER — VALACYCLOVIR HCL 1 G PO TABS: 1000.0000 mg | ORAL_TABLET | Freq: Every day | ORAL | 11 refills | Status: AC

## 2019-07-11 NOTE — Progress Notes (Signed)
GYNECOLOGY ANNUAL PREVENTATIVE CARE ENCOUNTER NOTE  History:     Takya Vandivier is a 28 y.o. No obstetric history on file. female here for a routine annual gynecologic exam.  Current complaints: none.  Denies abnormal vaginal bleeding, discharge, pelvic pain, problems with intercourse or other gynecologic concerns.    Gynecologic History No LMP recorded. Contraception: OCP (estrogen/progesterone) Last Pap: 2018. Results were: normal with negative HPV Last mammogram:NA  Obstetric History OB History  No obstetric history on file.    Past Medical History:  Diagnosis Date  . Asthma     Past Surgical History:  Procedure Laterality Date  . NASAL SINUS SURGERY    . TONSILLECTOMY AND ADENOIDECTOMY      Current Outpatient Medications on File Prior to Visit  Medication Sig Dispense Refill  . amphetamine-dextroamphetamine (ADDERALL) 20 MG tablet Take 20 mg by mouth daily.    . clonazePAM (KLONOPIN) 1 MG tablet Take 1 mg by mouth 3 (three) times daily as needed for anxiety.    . gabapentin (NEURONTIN) 300 MG capsule Take 1 capsule (300 mg total) by mouth at bedtime. May increase to 600 mg after 1-2 weeks. 90 capsule 0  . ibuprofen (ADVIL,MOTRIN) 800 MG tablet Take 1 tablet (800 mg total) by mouth 3 (three) times daily. 21 tablet 0  . QUEtiapine (SEROQUEL) 100 MG tablet Take 75 mg by mouth at bedtime.     . sertraline (ZOLOFT) 100 MG tablet Take 150 mg by mouth daily.     . ciprofloxacin (CIPRO) 500 MG tablet Take 1 tablet (500 mg total) by mouth 2 (two) times daily. One po bid x 7 days (Patient not taking: Reported on 10/03/2017) 14 tablet 0  . lidocaine (XYLOCAINE) 5 % ointment Apply 1 application topically as needed. (Patient not taking: Reported on 06/19/2019) 35.44 g 0  . metroNIDAZOLE (FLAGYL) 500 MG tablet Take 1 tablet (500 mg total) by mouth 2 (two) times daily. (Patient not taking: Reported on 06/19/2019) 14 tablet 0  . nitrofurantoin, macrocrystal-monohydrate, (MACROBID) 100 MG  capsule Take 1 capsule (100 mg total) by mouth 2 (two) times daily. (Patient not taking: Reported on 07/11/2019) 10 capsule 0  . phenazopyridine (PYRIDIUM) 200 MG tablet Take 1 tablet (200 mg total) by mouth 3 (three) times daily as needed for pain. (Patient not taking: Reported on 10/03/2017) 6 tablet 0  . phenazopyridine (PYRIDIUM) 95 MG tablet Take 95 mg by mouth 3 (three) times daily as needed for pain.    . traMADol (ULTRAM) 50 MG tablet Take 2 tablets (100 mg total) by mouth every 6 (six) hours as needed. (Patient not taking: Reported on 07/11/2019) 20 tablet 0   No current facility-administered medications on file prior to visit.    Allergies  Allergen Reactions  . Pollen Extract     Social History:  reports that she has never smoked. She has never used smokeless tobacco. She reports that she does not drink alcohol or use drugs.  Family History  Adopted: Yes    The following portions of the patient's history were reviewed and updated as appropriate: allergies, current medications, past family history, past medical history, past social history, past surgical history and problem list.  Review of Systems Pertinent items noted in HPI and remainder of comprehensive ROS otherwise negative.  Physical Exam:  BP 127/78   Pulse (!) 124   Wt 148 lb (67.1 kg)   BMI 25.40 kg/m  CONSTITUTIONAL: Well-developed, well-nourished female in no acute distress.  HENT:  Normocephalic,  atraumatic, External right and left ear normal. Oropharynx is clear and moist EYES: Conjunctivae and EOM are normal. Pupils are equal, round, and reactive to light. No scleral icterus.  NECK: Normal range of motion, supple, no masses.  Normal thyroid.  SKIN: Skin is warm and dry. No rash noted. Not diaphoretic. No erythema. No pallor. MUSCULOSKELETAL: Normal range of motion. No tenderness.  No cyanosis, clubbing, or edema.  2+ distal pulses. NEUROLOGIC: Alert and oriented to person, place, and time. Normal reflexes,  muscle tone coordination.  PSYCHIATRIC: Normal mood and affect. Normal behavior. Normal judgment and thought content. CARDIOVASCULAR: Normal heart rate noted, regular rhythm RESPIRATORY: Clear to auscultation bilaterally. Effort and breath sounds normal, no problems with respiration noted. BREASTS: Symmetric in size. No masses, tenderness, skin changes, nipple drainage, or lymphadenopathy bilaterally. ABDOMEN: Soft, no distention noted.  No tenderness, rebound or guarding.  PELVIC: Normal appearing external genitalia and urethral meatus; normal appearing vaginal mucosa and cervix.  No abnormal discharge noted.  Pap smear obtained.  Normal uterine size, no other palpable masses, no uterine or adnexal tenderness.   Assessment and Plan:    1. Women's annual routine gynecological examination  - Cytology - PAP( [place]) - Cervicovaginal ancillary only( [facility]) - Had an abnormal pap in the last 3 years and had a biopsy ([facility])  which was normal. Done in [place]  HX of HSV, no recent outbreaks. Rx. Valtrex.  RX Loestrin   Will follow up results of pap smear and manage accordingly. Routine preventative health maintenance measures emphasized. Please refer to After Visit Summary for other counseling recommendations.    Samari Bittinger, Harolyn Rutherford, NP [facility] [employer], [facility]

## 2019-07-11 NOTE — Progress Notes (Signed)
Dr. [facility] Need refill on [facility]

## 2019-07-12 LAB — CERVICOVAGINAL ANCILLARY ONLY
Bacterial Vaginitis (gardnerella): NEGATIVE
Candida Glabrata: NEGATIVE
Candida Vaginitis: NEGATIVE
Chlamydia: NEGATIVE
Comment: NEGATIVE
Comment: NEGATIVE
Comment: NEGATIVE
Comment: NEGATIVE
Comment: NEGATIVE
Comment: NORMAL
Neisseria Gonorrhea: NEGATIVE
Trichomonas: NEGATIVE

## 2019-07-17 ENCOUNTER — Telehealth: Payer: Self-pay | Admitting: *Deleted

## 2019-07-17 NOTE — Telephone Encounter (Signed)
Pt calling for information regarding testing sites. Provided options for scheduling. BF has tested positive. Quarantine precautions reviewed. Pt verbalizes understanding.

## 2019-07-18 LAB — CYTOLOGY - PAP
Comment: NEGATIVE
Diagnosis: UNDETERMINED — AB
High risk HPV: NEGATIVE

## 2019-07-19 ENCOUNTER — Other Ambulatory Visit: Payer: Managed Care, Other (non HMO)

## 2019-08-03 ENCOUNTER — Emergency Department (HOSPITAL_BASED_OUTPATIENT_CLINIC_OR_DEPARTMENT_OTHER)
Admission: EM | Admit: 2019-08-03 | Discharge: 2019-08-03 | Disposition: A | Discharge status: Home / Self Care | Payer: Managed Care, Other (non HMO) | Attending: Emergency Medicine | Admitting: Emergency Medicine

## 2019-08-03 ENCOUNTER — Encounter (HOSPITAL_BASED_OUTPATIENT_CLINIC_OR_DEPARTMENT_OTHER): Payer: Self-pay | Admitting: Emergency Medicine

## 2019-08-03 ENCOUNTER — Emergency Department (HOSPITAL_BASED_OUTPATIENT_CLINIC_OR_DEPARTMENT_OTHER): Payer: Managed Care, Other (non HMO)

## 2019-08-03 ENCOUNTER — Other Ambulatory Visit: Payer: Self-pay

## 2019-08-03 DIAGNOSIS — Z79899 Other long term (current) drug therapy: Secondary | ICD-10-CM | POA: Diagnosis not present

## 2019-08-03 DIAGNOSIS — J209 Acute bronchitis, unspecified: Secondary | ICD-10-CM | POA: Diagnosis not present

## 2019-08-03 DIAGNOSIS — J208 Acute bronchitis due to other specified organisms: Secondary | ICD-10-CM

## 2019-08-03 DIAGNOSIS — J45909 Unspecified asthma, uncomplicated: Secondary | ICD-10-CM | POA: Diagnosis not present

## 2019-08-03 DIAGNOSIS — U071 COVID-19: Secondary | ICD-10-CM | POA: Diagnosis not present

## 2019-08-03 MED ORDER — ALBUTEROL SULFATE HFA 108 (90 BASE) MCG/ACT IN AERS
4.0000 | INHALATION_SPRAY | Freq: Once | RESPIRATORY_TRACT | Status: AC
  Administered 2019-08-03: 4 via RESPIRATORY_TRACT

## 2019-08-03 MED ORDER — GUAIFENESIN-CODEINE 100-10 MG/5ML PO SOLN
10.0000 mL | Freq: Once | ORAL | Status: AC
  Administered 2019-08-03: 10 mL via ORAL
  Filled 2019-08-03: qty 10

## 2019-08-03 MED ORDER — DEXAMETHASONE 6 MG PO TABS: 6.0000 mg | ORAL_TABLET | Freq: Every day | ORAL | 0 refills | Status: DC

## 2019-08-03 MED ORDER — IBUPROFEN 400 MG PO TABS
400.0000 mg | ORAL_TABLET | Freq: Once | ORAL | Status: AC
  Administered 2019-08-03: 400 mg via ORAL
  Filled 2019-08-03: qty 1

## 2019-08-03 MED ORDER — DEXAMETHASONE 4 MG PO TABS
8.0000 mg | ORAL_TABLET | Freq: Once | ORAL | Status: AC
  Administered 2019-08-03: 8 mg via ORAL
  Filled 2019-08-03: qty 2

## 2019-08-03 MED ORDER — ALBUTEROL SULFATE HFA 108 (90 BASE) MCG/ACT IN AERS
2.0000 | INHALATION_SPRAY | RESPIRATORY_TRACT | Status: DC | PRN
  Filled 2019-08-03: qty 6.7

## 2019-08-03 MED ORDER — BENZONATATE 100 MG PO CAPS: 100.0000 mg | ORAL_CAPSULE | Freq: Three times a day (TID) | ORAL | 0 refills | Status: DC | PRN

## 2019-08-03 NOTE — ED Triage Notes (Signed)
Dx with COVID on 12/18. C/o ongoing SOB, cough, and fatigue.

## 2019-08-03 NOTE — ED Provider Notes (Signed)
[facility]GENCY DEPARTMENT Provider Note   CSN: [id number] Arrival date & time: 08/03/19  0901     History Chief Complaint  Patient presents with  . Shortness of Breath    COVID+    Christy Willis is a 29 y.o. female.    29 year old female with shortness of breath.  Coughing.  She reports testing positive for Covid on 07/09/2019.  She is feeling achy and had some mild dyspnea at the time.  She feels like her breathing is worsened for the last 72 hours.  She has been coughing and wheezing.  Cough is nonproductive.  Occasional sharp left-sided chest pain when she coughs.  Subjective fevers.  No vomiting or diarrhea.  No urinary complaints.  No unusual leg pain or swelling.  No sick contacts that she is aware of.  Past Medical History:  Diagnosis Date  . Asthma     Patient Active Problem List   Diagnosis Date Noted  . HSV (herpes simplex virus) infection 07/11/2019    Past Surgical History:  Procedure Laterality Date  . NASAL SINUS SURGERY    . TONSILLECTOMY AND ADENOIDECTOMY       OB History   No obstetric history on file.     Family History  Adopted: Yes    Social History   Tobacco Use  . Smoking status: Current Every Day Smoker    Types: E-cigarettes  . Smokeless tobacco: Never Used  Substance Use Topics  . Alcohol use: No  . Drug use: No    Home Medications Prior to Admission medications   Medication Sig Start Date End Date Taking? Authorizing Provider  amphetamine-dextroamphetamine (ADDERALL) 20 MG tablet Take 20 mg by mouth daily.    [provider]  ciprofloxacin (CIPRO) 500 MG tablet Take 1 tablet (500 mg total) by mouth 2 (two) times daily. One po bid x 7 days Patient not taking: Reported on 10/03/2017 08/18/17   Veryl Speak, MD  clonazePAM (KLONOPIN) 1 MG tablet Take 1 mg by mouth 3 (three) times daily as needed for anxiety.    [provider]  gabapentin (NEURONTIN) 300 MG capsule Take 1 capsule (300 mg total) by mouth  at bedtime. May increase to 600 mg after 1-2 weeks. 10/03/17   Rasch, Anderson Malta I, NP  ibuprofen (ADVIL,MOTRIN) 800 MG tablet Take 1 tablet (800 mg total) by mouth 3 (three) times daily. 09/23/17   Charlesetta Shanks, MD  lidocaine (XYLOCAINE) 5 % ointment Apply 1 application topically as needed. Patient not taking: Reported on 06/19/2019 05/08/18   Starr Lake, CNM  metroNIDAZOLE (FLAGYL) 500 MG tablet Take 1 tablet (500 mg total) by mouth 2 (two) times daily. Patient not taking: Reported on 06/19/2019 10/24/18   Anyanwu, Sallyanne Havers, MD  nitrofurantoin, macrocrystal-monohydrate, (MACROBID) 100 MG capsule Take 1 capsule (100 mg total) by mouth 2 (two) times daily. Patient not taking: Reported on 07/11/2019 06/19/19   Constant, Peggy, MD  norethindrone-ethinyl estradiol (LOESTRIN 1/20, 21,) 1-20 MG-MCG tablet Take 1 tablet by mouth daily. 07/11/19   Rasch, Anderson Malta I, NP  phenazopyridine (PYRIDIUM) 200 MG tablet Take 1 tablet (200 mg total) by mouth 3 (three) times daily as needed for pain. Patient not taking: Reported on 10/03/2017 08/18/17   Veryl Speak, MD  phenazopyridine (PYRIDIUM) 95 MG tablet Take 95 mg by mouth 3 (three) times daily as needed for pain.    [provider]  QUEtiapine (SEROQUEL) 100 MG tablet Take 75 mg by mouth at bedtime.  [provider]  sertraline (ZOLOFT) 100 MG tablet Take 150 mg by mouth daily.     [provider]  traMADol (ULTRAM) 50 MG tablet Take 2 tablets (100 mg total) by mouth every 6 (six) hours as needed. Patient not taking: Reported on 07/11/2019 09/23/17   Arby Barrette, MD  valACYclovir (VALTREX) 1000 MG tablet Take 1 tablet (1,000 mg total) by mouth daily. 07/11/19   Rasch, Victorino Dike I, NP    Allergies    Pollen extract  Review of Systems   Review of Systems All systems reviewed and negative, other than as noted in HPI.  Physical Exam Updated Vital Signs BP 125/78 (BP Location: Right Arm)   Pulse (!) 123   Temp  99.4 F (37.4 C) (Oral)   Resp 20   Ht 5\' 4"  (1.626 m)   Wt 65.8 kg   LMP 07/17/2019   SpO2 96%   BMI 24.89 kg/m   Physical Exam Vitals and nursing note reviewed.  Constitutional:      General: She is not in acute distress.    Appearance: She is well-developed.  HENT:     Head: Normocephalic and atraumatic.  Eyes:     General:        Right eye: No discharge.        Left eye: No discharge.     Conjunctiva/sclera: Conjunctivae normal.  Cardiovascular:     Rate and Rhythm: Regular rhythm. Tachycardia present.     Heart sounds: Normal heart sounds. No murmur. No friction rub. No gallop.   Pulmonary:     Effort: Pulmonary effort is normal. No respiratory distress.     Breath sounds: Wheezing present.  Abdominal:     General: There is no distension.     Palpations: Abdomen is soft.     Tenderness: There is no abdominal tenderness.  Musculoskeletal:        General: No tenderness.     Cervical back: Neck supple.  Skin:    General: Skin is warm and dry.  Neurological:     Mental Status: She is alert.  Psychiatric:        Behavior: Behavior normal.        Thought Content: Thought content normal.     ED Results / Procedures / Treatments   Labs (all labs ordered are listed, but only abnormal results are displayed) Labs Reviewed - No data to display  EKG None  Radiology DG Chest Portable 1 View  Result Date: 08/03/2019 CLINICAL DATA:  Tested positive for Covid on December 18; fever at that time; pt having increased chest pressure and SOB; no fever at this time; tired feeling EXAM: PORTABLE CHEST 1 VIEW COMPARISON:  None. FINDINGS: The heart size and mediastinal contours are within normal limits. The lungs are clear. No pneumothorax or large pleural effusion. The visualized skeletal structures are unremarkable. IMPRESSION: No evidence of active disease. Electronically Signed   By: December 20 M.D.   On: 08/03/2019 09:55    Procedures Procedures (including critical  care time)  Medications Ordered in ED Medications  albuterol (VENTOLIN HFA) 108 (90 Base) MCG/ACT inhaler 2 puff (has no administration in time range)  dexamethasone (DECADRON) tablet 8 mg (has no administration in time range)  ibuprofen (ADVIL) tablet 400 mg (has no administration in time range)  guaiFENesin-codeine 100-10 MG/5ML solution 10 mL (has no administration in time range)  albuterol (VENTOLIN HFA) 108 (90 Base) MCG/ACT inhaler 4 puff (4 puffs Inhalation Given 08/03/19 0927)  ED Course  I have reviewed the triage vital signs and the nursing notes.  Pertinent labs & imaging results that were available during my care of the patient were reviewed by me and considered in my medical decision making (see chart for details).    MDM Rules/Calculators/A&P                      28yF with cough and dyspnea. Significant wheezing on exam. Better after tx in the ED. Will place on steroids. PRN cough medications. Has prescribed bronchodilators already. Return precautions discussed.   Sidra Oldfield was evaluated in Emergency Department on 08/04/2019 for the symptoms described in the history of present illness. She was evaluated in the context of the global COVID-19 pandemic, which necessitated consideration that the patient might be at risk for infection with the SARS-CoV-2 virus that causes COVID-19. Institutional protocols and algorithms that pertain to the evaluation of patients at risk for COVID-19 are in a state of rapid change based on information released by regulatory bodies including the CDC and federal and state organizations. These policies and algorithms were followed during the patient's care in the ED.  Final Clinical Impression(s) / ED Diagnoses Final diagnoses:  Acute bronchitis due to COVID-19 virus    Rx / DC Orders ED Discharge Orders         Ordered    dexamethasone (DECADRON) 6 MG tablet  Daily     08/03/19 1005    benzonatate (TESSALON) 100 MG capsule  3 times daily PRN       08/03/19 1005           Raeford Razor, MD 08/04/19 1321

## 2019-08-13 ENCOUNTER — Encounter: Payer: Self-pay | Admitting: Physician Assistant

## 2019-09-02 ENCOUNTER — Telehealth: Payer: Managed Care, Other (non HMO) | Admitting: Family Medicine

## 2019-09-05 ENCOUNTER — Telehealth: Payer: Self-pay

## 2019-09-05 ENCOUNTER — Other Ambulatory Visit: Payer: Self-pay

## 2019-09-05 DIAGNOSIS — N760 Acute vaginitis: Secondary | ICD-10-CM

## 2019-09-05 DIAGNOSIS — B9689 Other specified bacterial agents as the cause of diseases classified elsewhere: Secondary | ICD-10-CM

## 2019-09-05 MED ORDER — METRONIDAZOLE 500 MG PO TABS: 500.0000 mg | ORAL_TABLET | Freq: Two times a day (BID) | ORAL | 0 refills | Status: DC

## 2019-09-05 NOTE — Telephone Encounter (Signed)
Called pt to ask if she was still having symptoms of BV & she stated yes, advised will send Flagyl to pharmacy. If symptoms does not clear she will need to make Nurse visit appt. For Self Swab.Pt verbalized understanding.

## 2019-09-09 ENCOUNTER — Telehealth (INDEPENDENT_AMBULATORY_CARE_PROVIDER_SITE_OTHER): Payer: Managed Care, Other (non HMO) | Admitting: Family Medicine

## 2019-09-09 ENCOUNTER — Encounter: Payer: Self-pay | Admitting: Family Medicine

## 2019-09-09 VITALS — Ht 64.0 in | Wt 162.0 lb

## 2019-09-09 DIAGNOSIS — F3341 Major depressive disorder, recurrent, in partial remission: Secondary | ICD-10-CM | POA: Diagnosis not present

## 2019-09-09 DIAGNOSIS — F411 Generalized anxiety disorder: Secondary | ICD-10-CM

## 2019-09-09 DIAGNOSIS — F909 Attention-deficit hyperactivity disorder, unspecified type: Secondary | ICD-10-CM | POA: Diagnosis not present

## 2019-09-09 DIAGNOSIS — E663 Overweight: Secondary | ICD-10-CM | POA: Diagnosis not present

## 2019-09-09 NOTE — Telephone Encounter (Signed)
Pt e-prescribed Flagyl for tx per protocol for BV.   Addison Naegeli, RN

## 2019-09-09 NOTE — Progress Notes (Signed)
Virtual Visit via Video Note   I connected with Christy Reza on 09/09/19 by a video enabled telemedicine application and verified that I am speaking with the correct person using two identifiers.  Location patient: home Location provider:work office Persons participating in the virtual visit: patient, provider  I discussed the limitations of evaluation and management by telemedicine and the availability of in person appointments. The patient expressed understanding and agreed to proceed.   HPI: Christy Willis is a 29 yo female who is establishing care today. Former PCP: Christy Smith Mince.  Hx of asthma and allergies,following with immunologist, Dr Donneta Romberg. She is on [employer]  Anxiety,derpessession,dyslexia,and ADHD. She follows with psychiatrist (Dr Synetta Shadow) Also following with counselor once per month, Christy Karrie Doffing. Dx'ed with ADHD, she has been on pharmacologic treatment intermittent  Currently she is on Adderall,Gabapentin,Clonazepam,and Sertraline.  Frustrated about wt gain. She is working for home and has not been consistent with following a healthful diet or regular physical activity. She recently started eating healthier and planning on engaging in regular exercise.  Former smoker, she is vaping.  ROS: See pertinent positives and negatives per HPI.  Past Medical History:  Diagnosis Date  . ADHD    Follows with Dr Synetta Shadow  . Allergic rhinitis   . Anxiety disorder    Dr Synetta Shadow  . Asthma    Follows with immunologist. On immunologic therapy.  . Depression, major, recurrent ([place])    Dr Synetta Shadow  . Dyslexia     Past Surgical History:  Procedure Laterality Date  . NASAL SINUS SURGERY    . TONSILLECTOMY AND ADENOIDECTOMY      Family History  Adopted: Yes    Social History   Socioeconomic History  . Marital status: Divorced    Spouse name: Not on file  . Number of children: Not on file  . Years of education: Not on file  . Highest education level: Not on file  Occupational  History  . Not on file  Tobacco Use  . Smoking status: Former Smoker    Types: E-cigarettes  . Smokeless tobacco: Never Used  Substance and Sexual Activity  . Alcohol use: No  . Drug use: No  . Sexual activity: Yes    Birth control/protection: Pill  Other Topics Concern  . Not on file  Social History Narrative  . Not on file   Social Determinants of Health   Financial Resource Strain:   . Difficulty of Paying Living Expenses: Not on file  Food Insecurity:   . Worried About Charity fundraiser in the Last Year: Not on file  . Ran Out of Food in the Last Year: Not on file  Transportation Needs:   . Lack of Transportation (Medical): Not on file  . Lack of Transportation (Non-Medical): Not on file  Physical Activity:   . Days of Exercise per Week: Not on file  . Minutes of Exercise per Session: Not on file  Stress:   . Feeling of Stress : Not on file  Social Connections:   . Frequency of Communication with Friends and Family: Not on file  . Frequency of Social Gatherings with Friends and Family: Not on file  . Attends Religious Services: Not on file  . Active Member of Clubs or Organizations: Not on file  . Attends Archivist Meetings: Not on file  . Marital Status: Not on file  Intimate Partner Violence:   . Fear of Current or Ex-Partner: Not on file  . Emotionally Abused: Not  on file  . Physically Abused: Not on file  . Sexually Abused: Not on file    Current Outpatient Medications:  .  amphetamine-dextroamphetamine (ADDERALL) 20 MG tablet, Take 20 mg by mouth daily., Disp: , Rfl:  .  clonazePAM (KLONOPIN) 1 MG tablet, Take 1 mg by mouth 3 (three) times daily as needed for anxiety., Disp: , Rfl:  .  dexamethasone (DECADRON) 6 MG tablet, Take 1 tablet (6 mg total) by mouth daily., Disp: 5 tablet, Rfl: 0 .  gabapentin (NEURONTIN) 300 MG capsule, Take 1 capsule (300 mg total) by mouth at bedtime. May increase to 600 mg after 1-2 weeks., Disp: 90 capsule, Rfl:  0 .  ibuprofen (ADVIL,MOTRIN) 800 MG tablet, Take 1 tablet (800 mg total) by mouth 3 (three) times daily., Disp: 21 tablet, Rfl: 0 .  norethindrone-ethinyl estradiol (LOESTRIN 1/20, 21,) 1-20 MG-MCG tablet, Take 1 tablet by mouth daily., Disp: 1 Package, Rfl: 11 .  QUEtiapine (SEROQUEL) 100 MG tablet, Take 75 mg by mouth at bedtime. , Disp: , Rfl:  .  sertraline (ZOLOFT) 100 MG tablet, Take 150 mg by mouth daily. , Disp: , Rfl:  .  valACYclovir (VALTREX) 1000 MG tablet, Take 1 tablet (1,000 mg total) by mouth daily., Disp: 30 tablet, Rfl: 11  EXAM:  VITALS per patient if applicable:Ht 5\' 4"  (1.626 m)   Wt 162 lb (73.5 kg)   BMI 27.81 kg/m   GENERAL: alert, oriented, appears well and in no acute distress  HEENT: atraumatic, conjunctiva clear, no obvious abnormalities on inspection.  NECK: normal movements of the head and neck  LUNGS: on inspection no signs of respiratory distress, breathing rate appears normal, no obvious gross SOB, gasping or wheezing  CV: no obvious cyanosis  Christy: moves all visible extremities without noticeable abnormality  PSYCH/NEURO: pleasant and cooperative, no obvious depression or anxiety, speech and thought processing grossly intact  ASSESSMENT AND PLAN:  Discussed the following assessment and plan:  Attention deficit hyperactivity disorder (ADHD), unspecified ADHD type Following with psychiatrist.  Overweight (BMI 25.0-29.9) Planning on making some changes in her diet and engaging in regular physical activity.  GAD (generalized anxiety disorder) Continue Sertraline and Clonazepam. Regular exercise will also help. Following with psychiatrist and counselor.  Recurrent major depressive disorder, in partial remission (HCC) Stable. Continue following with psychiatrist.   I discussed the assessment and treatment plan with the patient. Christy Cinnamon was provided an opportunity to ask questions and all were answered.She agreed with the plan and demonstrated  an understanding of the instructions.    Return in about 3 months (around 12/07/2019) for cpe.    Juanisha Bautch 02/06/2020, MD

## 2019-09-10 NOTE — Progress Notes (Signed)
Patient is scheduled for her CPE with Dr. [place] on 12/06/2019 at 8:30

## 2019-12-05 ENCOUNTER — Other Ambulatory Visit: Payer: Self-pay

## 2019-12-06 ENCOUNTER — Other Ambulatory Visit: Payer: Self-pay

## 2019-12-06 ENCOUNTER — Encounter: Payer: Self-pay | Admitting: Family Medicine

## 2019-12-06 ENCOUNTER — Ambulatory Visit (INDEPENDENT_AMBULATORY_CARE_PROVIDER_SITE_OTHER): Payer: Managed Care, Other (non HMO) | Admitting: Family Medicine

## 2019-12-06 VITALS — BP 120/74 | HR 95 | Temp 98.1°F | Resp 12 | Ht 64.0 in | Wt 165.0 lb

## 2019-12-06 DIAGNOSIS — F339 Major depressive disorder, recurrent, unspecified: Secondary | ICD-10-CM

## 2019-12-06 DIAGNOSIS — Z1329 Encounter for screening for other suspected endocrine disorder: Secondary | ICD-10-CM

## 2019-12-06 DIAGNOSIS — Z130 Encounter for screening for diseases of the blood and blood-forming organs and certain disorders involving the immune mechanism: Secondary | ICD-10-CM

## 2019-12-06 DIAGNOSIS — M791 Myalgia, unspecified site: Secondary | ICD-10-CM | POA: Diagnosis not present

## 2019-12-06 DIAGNOSIS — Z0000 Encounter for general adult medical examination without abnormal findings: Secondary | ICD-10-CM

## 2019-12-06 DIAGNOSIS — Z13228 Encounter for screening for other metabolic disorders: Secondary | ICD-10-CM | POA: Diagnosis not present

## 2019-12-06 DIAGNOSIS — Z13220 Encounter for screening for lipoid disorders: Secondary | ICD-10-CM | POA: Diagnosis not present

## 2019-12-06 DIAGNOSIS — L659 Nonscarring hair loss, unspecified: Secondary | ICD-10-CM

## 2019-12-06 LAB — BASIC METABOLIC PANEL
BUN: 13 mg/dL (ref 6–23)
CO2: 31 mEq/L (ref 19–32)
Calcium: 9.2 mg/dL (ref 8.4–10.5)
Chloride: 102 mEq/L (ref 96–112)
Creatinine, Ser: 0.67 mg/dL (ref 0.40–1.20)
GFR: 103.69 mL/min (ref >60.00)
Glucose, Bld: 85 mg/dL (ref 70–99)
Potassium: 4 mEq/L (ref 3.5–5.1)
Sodium: 139 mEq/L (ref 135–145)

## 2019-12-06 LAB — CBC
HCT: 41.1 % (ref 36.0–46.0)
Hemoglobin: 13.9 g/dL (ref 12.0–15.0)
MCHC: 33.8 g/dL (ref 30.0–36.0)
MCV: 87.1 fl (ref 78.0–100.0)
Platelets: 323 10*3/uL (ref 150.0–400.0)
RBC: 4.72 Mil/uL (ref 3.87–5.11)
RDW: 12.2 % (ref 11.5–15.5)
WBC: 7.8 10*3/uL (ref 4.0–10.5)

## 2019-12-06 LAB — FERRITIN: Ferritin: 28.3 ng/mL (ref 10.0–291.0)

## 2019-12-06 LAB — LIPID PANEL
Cholesterol: 201 mg/dL — ABNORMAL HIGH (ref 0–200)
HDL: 48 mg/dL (ref >39.00)
LDL Cholesterol: 118 mg/dL — ABNORMAL HIGH (ref 0–99)
NonHDL: 152.6
Total CHOL/HDL Ratio: 4
Triglycerides: 172 mg/dL — ABNORMAL HIGH (ref 0.0–149.0)
VLDL: 34.4 mg/dL (ref 0.0–40.0)

## 2019-12-06 LAB — TSH: TSH: 3.07 u[IU]/mL (ref 0.35–4.50)

## 2019-12-06 LAB — HEMOGLOBIN A1C: Hgb A1c MFr Bld: 5.7 % (ref 4.6–6.5)

## 2019-12-06 LAB — C-REACTIVE PROTEIN: CRP: <1 mg/dL (ref 0.5–20.0)

## 2019-12-06 LAB — SEDIMENTATION RATE: Sed Rate: 24 mm/hr — ABNORMAL HIGH (ref 0–20)

## 2019-12-06 NOTE — Progress Notes (Signed)
HPI:   Christy Willis is a 29 y.o. female, who is here today for her routine physical.  Last CPE: > a year ago. Gyn routine examination 07/11/19.  Regular exercise 3 or more time per week: Not consistently. Following a healthy diet: She has not being consistent. She has lost some wt , decreased appetite due to stress/worsening anxiety and depression. She lives with her boyfriend.  Chronic medical problems: Anxiety,depression,asthma,allergies,and ADHD among some.  Pap smear: 11/08/16  Immunization History  Administered Date(s) Administered  . Influenza,inj,quad, With Preservative 06/01/2017  . Influenza-Unspecified 06/20/2018  . PFIZER SARS-COV-2 Vaccination 10/17/2019, 11/08/2019  . Tdap 12/26/2017    Concerns today:  She is been "bullied"at work, aggravating anxiety. Friends died suddenly a few months ago.  During "relaxed situations" she has difficulty with word fining, repeating her self a lot. Worse since COVID 19 infection.   Hair loss: Getting worse for the past couple months. No areas of alopecia or scalp lesions. She has not used OTC treatments. No new hair product.  Pain all over. + Arthralgias and myalgias.  Following with chiropractor. She had 16 X ray and otherwise normal.  +Limitation of elbows ROM and stiffness, worse in the morning. No edema or erythema.   Review of Systems  Constitutional: Positive for fatigue. Negative for activity change, appetite change, fever and unexpected weight change.  HENT: Negative for mouth sores, nosebleeds and sore throat.   Eyes: Negative for redness and visual disturbance.  Respiratory: Negative for cough, shortness of breath and wheezing.   Cardiovascular: Negative for chest pain, palpitations and leg swelling.  Gastrointestinal: Negative for abdominal pain, nausea and vomiting.       Negative for changes in bowel habits.  Endocrine: Negative for cold intolerance, heat intolerance, polydipsia, polyphagia  and polyuria.  Genitourinary: Negative for decreased urine volume, dysuria and hematuria.  Musculoskeletal: Positive for arthralgias, back pain, myalgias and neck pain. Negative for gait problem.  Allergic/Immunologic: Positive for environmental allergies.  Neurological: Negative for syncope, weakness and headaches.  Psychiatric/Behavioral: Negative for confusion and hallucinations. The patient is nervous/anxious.    Current Outpatient Medications on File Prior to Visit  Medication Sig Dispense Refill  . ADVAIR DISKUS 250-50 MCG/DOSE AEPB 1 puff 2 (two) times daily.    Marland Kitchen albuterol (PROVENTIL) (2.5 MG/3ML) 0.083% nebulizer solution U 3 ML VIA NEB Q 6 H    . albuterol (VENTOLIN HFA) 108 (90 Base) MCG/ACT inhaler SMARTSIG:2 Puff(s) Via Inhaler PRN    . amphetamine-dextroamphetamine (ADDERALL) 20 MG tablet Take 20 mg by mouth daily.    . clonazePAM (KLONOPIN) 1 MG tablet Take 1 mg by mouth 3 (three) times daily as needed for anxiety.    Marland Kitchen EPINEPHrine 0.3 mg/0.3 mL IJ SOAJ injection USE AS DIRECTED AS NEEDED    . gabapentin (NEURONTIN) 300 MG capsule Take 1 capsule (300 mg total) by mouth at bedtime. May increase to 600 mg after 1-2 weeks. 90 capsule 0  . methocarbamol (ROBAXIN) 500 MG tablet     . montelukast (SINGULAIR) 10 MG tablet Take 10 mg by mouth daily.    . QUEtiapine (SEROQUEL) 25 MG tablet Take 75 mg by mouth at bedtime.    . sertraline (ZOLOFT) 50 MG tablet Take 150 mg by mouth daily.    . traMADol (ULTRAM) 50 MG tablet Take 50 mg by mouth every 6 (six) hours as needed.    . valACYclovir (VALTREX) 1000 MG tablet Take 1 tablet (1,000 mg total) by mouth daily.  30 tablet 11   No current facility-administered medications on file prior to visit.    Past Medical History:  Diagnosis Date  . ADHD    Follows with Dr Kenyon Ana  . Allergic rhinitis   . Anxiety disorder    Dr Kenyon Ana  . Asthma    Follows with immunologist. On immunologic therapy.  . Depression, major, recurrent (HCC)    Dr  Kenyon Ana  . Dyslexia     Past Surgical History:  Procedure Laterality Date  . NASAL SINUS SURGERY    . TONSILLECTOMY AND ADENOIDECTOMY      Allergies  Allergen Reactions  . Bee Pollen   . Pollen Extract     Family History  Adopted: Yes    Social History   Socioeconomic History  . Marital status: Divorced    Spouse name: Not on file  . Number of children: Not on file  . Years of education: Not on file  . Highest education level: Not on file  Occupational History  . Not on file  Tobacco Use  . Smoking status: Former Smoker    Types: E-cigarettes  . Smokeless tobacco: Never Used  Substance and Sexual Activity  . Alcohol use: No  . Drug use: No  . Sexual activity: Yes    Birth control/protection: Pill  Other Topics Concern  . Not on file  Social History Narrative  . Not on file   Social Determinants of Health   Financial Resource Strain:   . Difficulty of Paying Living Expenses:   Food Insecurity:   . Worried About Programme researcher, broadcasting/film/video in the Last Year:   . Barista in the Last Year:   Transportation Needs:   . Freight forwarder (Medical):   Marland Kitchen Lack of Transportation (Non-Medical):   Physical Activity:   . Days of Exercise per Week:   . Minutes of Exercise per Session:   Stress:   . Feeling of Stress :   Social Connections:   . Frequency of Communication with Friends and Family:   . Frequency of Social Gatherings with Friends and Family:   . Attends Religious Services:   . Active Member of Clubs or Organizations:   . Attends Banker Meetings:   Marland Kitchen Marital Status:     Vitals:   12/06/19 0842  BP: 120/74  Pulse: 95  Resp: 12  Temp: 98.1 F (36.7 C)  SpO2: 100%   Body mass index is 28.32 kg/m.   Wt Readings from Last 3 Encounters:  12/06/19 165 lb (74.8 kg)  09/09/19 162 lb (73.5 kg)  08/03/19 145 lb (65.8 kg)    Physical Exam  Nursing note and vitals reviewed. Constitutional: She is oriented to person, place, and  time. She appears well-developed. No distress.  HENT:  Head: Normocephalic and atraumatic.  Right Ear: Hearing, tympanic membrane, external ear and ear canal normal.  Left Ear: Hearing, tympanic membrane, external ear and ear canal normal.  Mouth/Throat: Uvula is midline, oropharynx is clear and moist and mucous membranes are normal.  Eyes: Pupils are equal, round, and reactive to light. Conjunctivae and EOM are normal.  Neck: No tracheal deviation present. No thyromegaly present.  Cardiovascular: Normal rate and regular rhythm.  No murmur heard. Pulses:      Dorsalis pedis pulses are 2+ on the right side and 2+ on the left side.       Posterior tibial pulses are 2+ on the right side and 2+ on the left side.  Respiratory: Effort normal and breath sounds normal. No respiratory distress.  GI: Soft. She exhibits no mass. There is no hepatomegaly. There is no abdominal tenderness.  Genitourinary:    Genitourinary Comments: Deferred to gyn.   Musculoskeletal:        General: No edema.       Arms:     Cervical back: Tenderness present.       Back:       Legs:     Comments: No major deformity or signs of synovitis appreciated.  Lymphadenopathy:    She has no cervical adenopathy.       Right: No supraclavicular adenopathy present.       Left: No supraclavicular adenopathy present.  Neurological: She is alert and oriented to person, place, and time. She has normal strength. No cranial nerve deficit. Coordination and gait normal.  Reflex Scores:      Bicep reflexes are 2+ on the right side and 2+ on the left side.      Patellar reflexes are 2+ on the right side and 2+ on the left side. Skin: Skin is warm. No rash noted. No erythema.  Psychiatric: Her mood appears anxious. Her affect is labile. Cognition and memory are normal. She exhibits a depressed mood. She expresses no suicidal ideation. She expresses no suicidal plans.  Well groomed, good eye contact.   ASSESSMENT AND PLAN:  Ms.  Nila Winker was here today annual physical examination.  Orders Placed This Encounter  Procedures  . Basic metabolic panel  . Lipid panel  . Hemoglobin A1c  . TSH  . CBC  . C-reactive protein  . Sedimentation rate  . Ferritin   Lab Results  Component Value Date   CHOL 201 (H) 12/06/2019   HDL 48.00 12/06/2019   LDLCALC 118 (H) 12/06/2019   TRIG 172.0 (H) 12/06/2019   CHOLHDL 4 12/06/2019   Lab Results  Component Value Date   CRP <1.0 12/06/2019   Lab Results  Component Value Date   TSH 3.07 12/06/2019   Lab Results  Component Value Date   WBC 7.8 12/06/2019   HGB 13.9 12/06/2019   HCT 41.1 12/06/2019   MCV 87.1 12/06/2019   PLT 323.0 12/06/2019    Lab Results  Component Value Date   CREATININE 0.67 12/06/2019   BUN 13 12/06/2019   NA 139 12/06/2019   K 4.0 12/06/2019   CL 102 12/06/2019   CO2 31 12/06/2019   Routine general medical examination at a health care facility We discussed the importance of regular physical activity and healthy diet for prevention of chronic illness and/or complications. Preventive guidelines reviewed. Vaccination up to date. Continue following with gyn for her female preventive care. Next CPE in a year.  Hair loss disorder Possible etiologies discussed. ? Telogen effluvium. Further recommendations according to lab results.  Screening for lipoid disorders -     Lipid panel  Screening for endocrine, metabolic and immunity disorder -     Basic metabolic panel -     Hemoglobin A1c  Myalgia ? Fibromyalgia. Educated about Dx.prognosis,and treatment options. She is already on Gabapentin for anxiety. Low impact physical activity and good sleep hygiene.  Major depression, recurrent, chronic (HCC) Anxiety and depression are not well controlled. Recommend arranging appt with psychiatrist sooner than planned if needed. Recommend CBT.   Return in 1 year (on 12/05/2020) for cpe.   Berklie Dethlefs G. [place], MD  [facility] [place] office.

## 2019-12-06 NOTE — Patient Instructions (Addendum)
Today you have you routine preventive visit. A few things to remember from today's visit:   Routine general medical examination at a health care facility  Hair loss disorder - Plan: TSH, CBC, Sedimentation rate, Ferritin  Screening for lipoid disorders - Plan: Lipid panel  Screening for endocrine, metabolic and immunity disorder - Plan: Basic metabolic panel, Hemoglobin A1c  Myalgia - Plan: C-reactive protein, Sedimentation rate  At least 150 minutes of moderate exercise per week, daily brisk walking for 15-30 min is a good exercise option. Healthy diet low in saturated (animal) fats and sweets and consisting of fresh fruits and vegetables, lean meats such as fish and white chicken and whole grains.  These are some of recommendations for screening depending of age and risk factors:  - Vaccines:  Tdap vaccine every 10 years.  Shingles vaccine recommended at age 54, could be given after 30 years of age but not sure about insurance coverage.   Pneumonia vaccines: Pneumovax at 65. Sometimes Pneumovax is giving earlier if history of smoking, lung disease,diabetes,kidney disease among some.  Screening for diabetes at age 25 and every 3 years.  Cervical cancer prevention:  Pap smear starts at 30 years of age and continues periodically until 30 years old in low risk women. Pap smear every 3 years between 85 and 49 years old. Pap smear every 3-5 years between women 30 and older if pap smear negative and HPV screening negative.   -Breast cancer: Mammogram: There is disagreement between experts about when to start screening in low risk asymptomatic female but recent recommendations are to start screening at 76 and not later than 30 years old , every 1-2 years and after 30 yo q 2 years. Screening is recommended until 31 years old but some women can continue screening depending of healthy issues.  Colon cancer screening: starts at 30 years old until 30 years old.  Cholesterol disorder  screening at age 46 and every 3 years.  Also recommended:  1. Dental visit- Brush and floss your teeth twice daily; visit your dentist twice a year. 2. Eye doctor- Get an eye exam at least every 2 years. 3. Helmet use- Always wear a helmet when riding a bicycle, motorcycle, rollerblading or skateboarding. 4. Safe sex- If you may be exposed to sexually transmitted infections, use a condom. 5. Seat belts- Seat belts can save your live; always wear one. 6. Smoke/Carbon Monoxide detectors- These detectors need to be installed on the appropriate level of your home. Replace batteries at least once a year. 7. Skin cancer- When out in the sun please cover up and use sunscreen 15 SPF or higher. 8. Violence- If anyone is threatening or hurting you, please tell your healthcare provider.  9. Drink alcohol in moderation- Limit alcohol intake to one drink or less per day. Never drink and drive.   Myofascial Pain Syndrome and Fibromyalgia Myofascial pain syndrome and fibromyalgia are both pain disorders. This pain may be felt mainly in your muscles.  Myofascial pain syndrome: ? Always has tender points in the muscle that will cause pain when pressed (trigger points). The pain may come and go. ? Usually affects your neck, upper back, and shoulder areas. The pain often radiates into your arms and hands.  Fibromyalgia: ? Has muscle pains and tenderness that come and go. ? Is often associated with fatigue and sleep problems. ? Has trigger points. ? Tends to be long-lasting (chronic), but is not life-threatening. Fibromyalgia and myofascial pain syndrome are not the same. However,  they often occur together. If you have both conditions, each can make the other worse. Both are common and can cause enough pain and fatigue to make day-to-day activities difficult. Both can be hard to diagnose because their symptoms are common in many other conditions. What are the causes? The exact causes of these conditions are  not known. What increases the risk? You are more likely to develop this condition if:  You have a family history of the condition.  You have certain triggers, such as: ? Spine disorders. ? An injury (trauma) or other physical stressors. ? Being under a lot of stress. ? Medical conditions such as osteoarthritis, rheumatoid arthritis, or lupus. What are the signs or symptoms? Fibromyalgia The main symptom of fibromyalgia is widespread pain and tenderness in your muscles. Pain is sometimes described as stabbing, shooting, or burning. You may also have:  Tingling or numbness.  Sleep problems and fatigue.  Problems with attention and concentration (fibro fog). Other symptoms may include:  Bowel and bladder problems.  Headaches.  Visual problems.  Problems with odors and noises.  Depression or mood changes.  Painful menstrual periods (dysmenorrhea).  Dry skin or eyes. These symptoms can vary over time. Myofascial pain syndrome Symptoms of myofascial pain syndrome include:  Tight, ropy bands of muscle.  Uncomfortable sensations in muscle areas. These may include aching, cramping, burning, numbness, tingling, and weakness.  Difficulty moving certain parts of the body freely (poor range of motion). How is this diagnosed? This condition may be diagnosed by your symptoms and medical history. You will also have a physical exam. In general:  Fibromyalgia is diagnosed if you have pain, fatigue, and other symptoms for more than 3 months, and symptoms cannot be explained by another condition.  Myofascial pain syndrome is diagnosed if you have trigger points in your muscles, and those trigger points are tender and cause pain elsewhere in your body (referred pain). How is this treated? Treatment for these conditions depends on the type that you have.  For fibromyalgia: ? Pain medicines, such as NSAIDs. ? Medicines for treating depression. ? Medicines for treating  seizures. ? Medicines that relax the muscles.  For myofascial pain: ? Pain medicines, such as NSAIDs. ? Cooling and stretching of muscles. ? Trigger point injections. ? Sound wave (ultrasound) treatments to stimulate muscles. Treating these conditions often requires a team of health care providers. These may include:  Your primary care provider.  Physical therapist.  Complementary health care providers, such as massage therapists or acupuncturists.  Psychiatrist for cognitive behavioral therapy. Follow these instructions at home: Medicines  Take over-the-counter and prescription medicines only as told by your health care provider.  Do not drive or use heavy machinery while taking prescription pain medicine.  If you are taking prescription pain medicine, take actions to prevent or treat constipation. Your health care provider may recommend that you: ? Drink enough fluid to keep your urine pale yellow. ? Eat foods that are high in fiber, such as fresh fruits and vegetables, whole grains, and beans. ? Limit foods that are high in fat and processed sugars, such as fried or sweet foods. ? Take an over-the-counter or prescription medicine for constipation. Lifestyle   Exercise as directed by your health care provider or physical therapist.  Practice relaxation techniques to control your stress. You may want to try: ? Biofeedback. ? Visual imagery. ? Hypnosis. ? Muscle relaxation. ? Yoga. ? Meditation.  Maintain a healthy lifestyle. This includes eating a healthy diet and  getting enough sleep.  Do not use any products that contain nicotine or tobacco, such as cigarettes and e-cigarettes. If you need help quitting, ask your health care provider. General instructions  Talk to your health care provider about complementary treatments, such as acupuncture or massage.  Consider joining a support group with others who are diagnosed with this condition.  Do not do activities that  stress or strain your muscles. This includes repetitive motions and heavy lifting.  Keep all follow-up visits as told by your health care provider. This is important. Where to find more information  National Fibromyalgia Association: www.fmaware.[place]: www.arthritis.org  American Chronic Pain Association: www.theacpa.org Contact a health care provider if:  You have new symptoms.  Your symptoms get worse or your pain is severe.  You have side effects from your medicines.  You have trouble sleeping.  Your condition is causing depression or anxiety. Summary  Myofascial pain syndrome and fibromyalgia are pain disorders.  Myofascial pain syndrome has tender points in the muscle that will cause pain when pressed (trigger points). Fibromyalgia also has muscle pains and tenderness that come and go, but this condition is often associated with fatigue and sleep disturbances.  Fibromyalgia and myofascial pain syndrome are not the same but often occur together, causing pain and fatigue that make day-to-day activities difficult.  Treatment for fibromyalgia includes taking medicines to relax the muscles and medicines for pain, depression, or seizures. Treatment for myofascial pain syndrome includes taking medicines for pain, cooling and stretching of muscles, and injecting medicines into trigger points.  Follow your health care provider's instructions for taking medicines and maintaining a healthy lifestyle. This information is not intended to replace advice given to you by your health care provider. Make sure you discuss any questions you have with your health care provider. Document Revised: 11/09/2018 Document Reviewed: 08/02/2017 Elsevier Patient Education  2020 [employer].

## 2019-12-07 ENCOUNTER — Encounter: Payer: Self-pay | Admitting: Family Medicine

## 2020-01-16 ENCOUNTER — Telehealth (INDEPENDENT_AMBULATORY_CARE_PROVIDER_SITE_OTHER): Payer: Managed Care, Other (non HMO) | Admitting: Family Medicine

## 2020-01-16 ENCOUNTER — Encounter: Payer: Self-pay | Admitting: Family Medicine

## 2020-01-16 VITALS — Wt 160.0 lb

## 2020-01-16 DIAGNOSIS — R112 Nausea with vomiting, unspecified: Secondary | ICD-10-CM | POA: Diagnosis not present

## 2020-01-16 MED ORDER — ONDANSETRON HCL 4 MG PO TABS: 4.0000 mg | ORAL_TABLET | Freq: Three times a day (TID) | ORAL | 0 refills | Status: DC | PRN

## 2020-01-16 NOTE — Patient Instructions (Signed)
-  I sent the medication(s) we discussed to your pharmacy: Meds ordered this encounter  Medications   ondansetron (ZOFRAN) 4 MG tablet    Sig: Take 1 tablet (4 mg total) by mouth every 8 (eight) hours as needed for nausea or vomiting.    Dispense:  10 tablet    Refill:  0   Drink plenty of fluids.  Eat a bland diet and avoid dairy for about 1 week.  Please let us know if you have any questions or concerns regarding this prescription.  I hope you are feeling better soon! Seek care promptly if your symptoms worsen, new concerns arise or you are not improving with treatment.      WORK SLIP:  Patient Christy Willis,  [id number], was seen for a medical visit today 01/16/2020. Please excuse from work until symptoms have resolved for 48 hours.  Sincerely: E-signature: Dr. Kriste Basque, DO [facility] [place] Ph: [phone]

## 2020-01-16 NOTE — Progress Notes (Signed)
Virtual Visit via Video Note  I connected with Christy Willis  on 01/16/20 at 10:20 AM EDT by a video enabled telemedicine application and verified that I am speaking with the correct person using two identifiers.  Location patient: home, [place] Location provider:work or home office Persons participating in the virtual visit: patient, provider  I discussed the limitations of evaluation and management by telemedicine and the availability of in person appointments. The patient expressed understanding and agreed to proceed.   HPI:  Acute visit for GI symptoms: -she has been helping a family (mother died in childbirth) that has been dealing with the stomach bug  -she started feeling sick 3 days ago -symptoms included nausea, vomiting, subjective fever, body aches, HA, malaise -last emesis was 2 days ago -feeling better today, drinking plenty of fluids but still with sig nauseous today and poor appetite -she had COVID19 in December and she also had the pfizer vaccine - fully vaccinated -FDLMP: about 3 weeks ago, denies chance of pregnancy -requesting work now  ROS: See pertinent positives and negatives per HPI.  Past Medical History:  Diagnosis Date  . ADHD    Follows with Dr Kenyon Ana  . Allergic rhinitis   . Anxiety disorder    Dr Kenyon Ana  . Asthma    Follows with immunologist. On immunologic therapy.  . Depression, major, recurrent (HCC)    Dr Kenyon Ana  . Dyslexia     Past Surgical History:  Procedure Laterality Date  . NASAL SINUS SURGERY    . TONSILLECTOMY AND ADENOIDECTOMY      Family History  Adopted: Yes    SOCIAL HX: see hpi   Current Outpatient Medications:  .  ADVAIR DISKUS 250-50 MCG/DOSE AEPB, 1 puff 2 (two) times daily., Disp: , Rfl:  .  albuterol (PROVENTIL) (2.5 MG/3ML) 0.083% nebulizer solution, U 3 ML VIA NEB Q 6 H, Disp: , Rfl:  .  albuterol (VENTOLIN HFA) 108 (90 Base) MCG/ACT inhaler, SMARTSIG:2 Puff(s) Via Inhaler PRN, Disp: , Rfl:  .  amphetamine-dextroamphetamine  (ADDERALL) 20 MG tablet, Take 20 mg by mouth daily., Disp: , Rfl:  .  clonazePAM (KLONOPIN) 1 MG tablet, Take 1 mg by mouth 3 (three) times daily as needed for anxiety., Disp: , Rfl:  .  EPINEPHrine 0.3 mg/0.3 mL IJ SOAJ injection, USE AS DIRECTED AS NEEDED, Disp: , Rfl:  .  gabapentin (NEURONTIN) 300 MG capsule, Take 1 capsule (300 mg total) by mouth at bedtime. May increase to 600 mg after 1-2 weeks., Disp: 90 capsule, Rfl: 0 .  methocarbamol (ROBAXIN) 500 MG tablet, , Disp: , Rfl:  .  montelukast (SINGULAIR) 10 MG tablet, Take 10 mg by mouth daily., Disp: , Rfl:  .  QUEtiapine (SEROQUEL) 25 MG tablet, Take 75 mg by mouth at bedtime., Disp: , Rfl:  .  sertraline (ZOLOFT) 50 MG tablet, Take 150 mg by mouth daily., Disp: , Rfl:  .  valACYclovir (VALTREX) 1000 MG tablet, Take 1 tablet (1,000 mg total) by mouth daily., Disp: 30 tablet, Rfl: 11 .  ondansetron (ZOFRAN) 4 MG tablet, Take 1 tablet (4 mg total) by mouth every 8 (eight) hours as needed for nausea or vomiting., Disp: 10 tablet, Rfl: 0 .  traMADol (ULTRAM) 50 MG tablet, Take 50 mg by mouth every 6 (six) hours as needed. (Patient not taking: Reported on 01/16/2020), Disp: , Rfl:   EXAM:  VITALS per patient if applicable:  GENERAL: alert, oriented, appears well and in no acute distress  HEENT: atraumatic, conjunttiva clear,  no obvious abnormalities on inspection of external nose and ears  NECK: normal movements of the head and neck  LUNGS: on inspection no signs of respiratory distress, breathing rate appears normal, no obvious gross SOB, gasping or wheezing  CV: no obvious cyanosis  MS: moves all visible extremities without noticeable abnormality  PSYCH/NEURO: pleasant and cooperative, no obvious depression or anxiety, speech and thought processing grossly intact  ASSESSMENT AND PLAN:  Discussed the following assessment and plan:  Nausea and vomiting, intractability of vomiting not specified, unspecified vomiting type  -we  discussed possible serious and likely etiologies, options for evaluation and workup, limitations of telemedicine visit vs in person visit, treatment, treatment risks and precautions. Pt prefers to treat via telemedicine empirically rather then risking or undertaking an in person visit at this moment. Suspect viral illlness given her exposure and symptoms. Glad she is starting to improve. Advised plenty of fluids, bland diet, sent nausea Rx given still with sig nausea. Work note provided. Patient agrees to seek prompt in person care if worsening, new symptoms arise, or if is not improving with treatment.   I discussed the assessment and treatment plan with the patient. The patient was provided an opportunity to ask questions and all were answered. The patient agreed with the plan and demonstrated an understanding of the instructions.   The patient was advised to call back or seek an in-person evaluation if the symptoms worsen or if the condition fails to improve as anticipated.   Lucretia Kern, DO

## 2020-02-11 ENCOUNTER — Encounter: Payer: Self-pay | Admitting: Family Medicine

## 2020-02-11 ENCOUNTER — Telehealth (INDEPENDENT_AMBULATORY_CARE_PROVIDER_SITE_OTHER): Payer: Managed Care, Other (non HMO) | Admitting: Family Medicine

## 2020-02-11 DIAGNOSIS — R0981 Nasal congestion: Secondary | ICD-10-CM | POA: Diagnosis not present

## 2020-02-11 DIAGNOSIS — J45901 Unspecified asthma with (acute) exacerbation: Secondary | ICD-10-CM | POA: Diagnosis not present

## 2020-02-11 DIAGNOSIS — H9201 Otalgia, right ear: Secondary | ICD-10-CM | POA: Diagnosis not present

## 2020-02-11 MED ORDER — AMOXICILLIN 500 MG PO CAPS: 500.0000 mg | ORAL_CAPSULE | Freq: Three times a day (TID) | ORAL | 0 refills | Status: DC

## 2020-02-11 NOTE — Progress Notes (Signed)
ear

## 2020-02-11 NOTE — Progress Notes (Signed)
Virtual Visit via Video Note  I connected with Christy Willis  on 02/11/20 at  3:20 PM EDT by a video enabled telemedicine application and verified that I am speaking with the correct person using two identifiers.  Location patient: home, [place] Location provider:work or home office Persons participating in the virtual visit: patient, provider  I discussed the limitations of evaluation and management by telemedicine and the availability of in person appointments. The patient expressed understanding and agreed to proceed.   HPI:  Acute visit for R ear pain: -started 3 days ago -symptoms include nasal congestion, PND, hoarseness, R ear pain, decreased hearing on the R - feels muffled, cough, some alb use 1-2 times per day for asthma symptoms - symptoms responding to the albuterol, subjective fever -denies drainage or blood from the ear, NVD -no known sick contacts, no swimming -hx of recurrent ear infections and sinus infections and has hx of ear tubes remotely, sinus surgery, hx of tonsillectomy as well -reports she really thinks this is an ear infection from prior experiences -on allergy shots, asthma regimen -fully vaccinated for covid19   ROS: See pertinent positives and negatives per HPI.  Past Medical History:  Diagnosis Date  . ADHD    Follows with Dr Kenyon Ana  . Allergic rhinitis   . Anxiety disorder    Dr Kenyon Ana  . Asthma    Follows with immunologist. On immunologic therapy.  . Depression, major, recurrent (HCC)    Dr Kenyon Ana  . Dyslexia     Past Surgical History:  Procedure Laterality Date  . NASAL SINUS SURGERY    . TONSILLECTOMY AND ADENOIDECTOMY      Family History  Adopted: Yes       Current Outpatient Medications:  .  ADVAIR DISKUS 250-50 MCG/DOSE AEPB, 1 puff 2 (two) times daily., Disp: , Rfl:  .  albuterol (PROVENTIL) (2.5 MG/3ML) 0.083% nebulizer solution, U 3 ML VIA NEB Q 6 H, Disp: , Rfl:  .  albuterol (VENTOLIN HFA) 108 (90 Base) MCG/ACT inhaler, SMARTSIG:2 Puff(s)  Via Inhaler PRN, Disp: , Rfl:  .  amphetamine-dextroamphetamine (ADDERALL) 20 MG tablet, Take 20 mg by mouth daily., Disp: , Rfl:  .  clonazePAM (KLONOPIN) 1 MG tablet, Take 1 mg by mouth 3 (three) times daily as needed for anxiety., Disp: , Rfl:  .  EPINEPHrine 0.3 mg/0.3 mL IJ SOAJ injection, USE AS DIRECTED AS NEEDED, Disp: , Rfl:  .  gabapentin (NEURONTIN) 300 MG capsule, Take 1 capsule (300 mg total) by mouth at bedtime. May increase to 600 mg after 1-2 weeks., Disp: 90 capsule, Rfl: 0 .  methocarbamol (ROBAXIN) 500 MG tablet, , Disp: , Rfl:  .  montelukast (SINGULAIR) 10 MG tablet, Take 10 mg by mouth daily., Disp: , Rfl:  .  ondansetron (ZOFRAN) 4 MG tablet, Take 1 tablet (4 mg total) by mouth every 8 (eight) hours as needed for nausea or vomiting., Disp: 10 tablet, Rfl: 0 .  QUEtiapine (SEROQUEL) 25 MG tablet, Take 75 mg by mouth at bedtime., Disp: , Rfl:  .  sertraline (ZOLOFT) 50 MG tablet, Take 150 mg by mouth daily., Disp: , Rfl:  .  traMADol (ULTRAM) 50 MG tablet, Take 50 mg by mouth every 6 (six) hours as needed. , Disp: , Rfl:  .  valACYclovir (VALTREX) 1000 MG tablet, Take 1 tablet (1,000 mg total) by mouth daily., Disp: 30 tablet, Rfl: 11 .  amoxicillin (AMOXIL) 500 MG capsule, Take 1 capsule (500 mg total) by mouth 3 (three)  times daily., Disp: 30 capsule, Rfl: 0  EXAM:  VITALS per patient if applicable:  GENERAL: alert, oriented, no acute distress  HEENT: atraumatic, conjunttiva clear, no obvious abnormalities on inspection of external nose and ears  NECK: normal movements of the head and neck  LUNGS: on inspection no signs of respiratory distress, breathing rate appears normal, no obvious gross SOB, gasping or wheezing  CV: no obvious cyanosis  MS: moves all visible extremities without noticeable abnormality  PSYCH/NEURO: pleasant and cooperative, no obvious depression or anxiety, speech and thought processing grossly intact  ASSESSMENT AND PLAN:  Discussed the  following assessment and plan:  Right ear pain  Sinus congestion  Asthma with acute exacerbation, unspecified asthma severity, unspecified whether persistent  -we discussed possible serious and likely etiologies, options for evaluation and workup, limitations of telemedicine visit vs in person visit, treatment, treatment risks and precautions. Pt prefers to treat via telemedicine empirically rather then risking or undertaking an in person visit at this moment. Query VURI with possible ROM and asthma exacerbation vs other. Advised am unable to examine the ear via video visit to [facility] a definitive evaluation and offered to assist with arranging an inperson visit. She prefers to try instead empiric treatment for otitis media as feels strongly she has an ear infection. Also advised nasal saline and her asthma/allergy medication along with the prn albuterol. She feels the albuterol is working for her asthma symptoms. Advised staying home while sick and seeking prompt in person evaluation if worsening or not improving with treatment over the next 24-48 hours. Work note provided in patient instructions.   I discussed the assessment and treatment plan with the patient. The patient was provided an opportunity to ask questions and all were answered. The patient agreed with the plan and demonstrated an understanding of the instructions.   The patient was advised to call back or seek an in-person evaluation if the symptoms worsen or if the condition fails to improve as anticipated.   Terressa Koyanagi, DO

## 2020-02-11 NOTE — Patient Instructions (Addendum)
-  I sent the medication(s) we discussed to your pharmacy: Meds ordered this encounter  Medications  . amoxicillin (AMOXIL) 500 MG capsule    Sig: Take 1 capsule (500 mg total) by mouth 3 (three) times daily.    Dispense:  30 capsule    Refill:  0   Use your albuterol as needed per instructions. Seek immediate inperson care if albuterol is not working for your asthma or breathing symptoms.  Nasal saline twice daily.  Continue your allergy and asthma medications  Please let us know if you have any questions or concerns regarding this prescription.  I hope you are feeling better soon! Seek care promptly if your symptoms worsen, new concerns arise or you are not improving with treatment.     WORK SLIP:  Patient Christy Willis,  09/14/1990, was seen for a medical visit today, 02/11/2020. Please excuse from work today and tomorrow and until has no fever and respiratory symptoms are improving for at least 24 hours.   Sincerely: E-signature: Dr. Kriste Basque, DO [facility] Ph: [phone]

## 2020-03-20 ENCOUNTER — Telehealth: Payer: Self-pay | Admitting: Family Medicine

## 2020-03-20 DIAGNOSIS — M797 Fibromyalgia: Secondary | ICD-10-CM

## 2020-03-20 NOTE — Telephone Encounter (Signed)
Patient is on short term disability Fibromyalgia.  R hand is swollen when she first gets up and her middle finger is so tight it is bent at the last knuckle.  She is requesting a referral to [facility].  Pt is requesting a call to let her know an update.  She can send the pictures to MyChart if needed.

## 2020-03-20 NOTE — Telephone Encounter (Signed)
Okay for referral to [facility]?

## 2020-03-23 NOTE — Telephone Encounter (Signed)
It is ok to place referral as requested. Thanks, BJ 

## 2020-04-07 ENCOUNTER — Encounter: Payer: Self-pay | Admitting: Family Medicine

## 2020-04-07 ENCOUNTER — Other Ambulatory Visit: Payer: Self-pay

## 2020-04-07 ENCOUNTER — Ambulatory Visit: Payer: Managed Care, Other (non HMO) | Admitting: Family Medicine

## 2020-04-07 DIAGNOSIS — M2550 Pain in unspecified joint: Secondary | ICD-10-CM

## 2020-04-07 DIAGNOSIS — R202 Paresthesia of skin: Secondary | ICD-10-CM

## 2020-04-07 DIAGNOSIS — M79641 Pain in right hand: Secondary | ICD-10-CM | POA: Diagnosis not present

## 2020-04-07 DIAGNOSIS — M545 Low back pain, unspecified: Secondary | ICD-10-CM

## 2020-04-07 DIAGNOSIS — R200 Anesthesia of skin: Secondary | ICD-10-CM | POA: Diagnosis not present

## 2020-04-07 DIAGNOSIS — G8929 Other chronic pain: Secondary | ICD-10-CM

## 2020-04-07 MED ORDER — NABUMETONE 500 MG PO TABS: 500.0000 mg | ORAL_TABLET | Freq: Two times a day (BID) | ORAL | 3 refills | Status: AC | PRN

## 2020-04-07 NOTE — Progress Notes (Addendum)
Office Visit Note   Patient: Christy Willis           Date of Birth: [birth year]           MRN: [id number] Visit Date: 04/07/2020 Requested by: [place], Betty G, MD [address] [place],  [place] [address] PCP: [place], Betty G, MD     Subjective: Chief Complaint  Patient presents with  . Lower Back - Pain    Referred by Dr. Hollice Espy - see records.  . Right Hand - Pain    Pain in the right hand, with stiffness and clicking in several fingers. Right hand dominant.    HPI: She is here at the request of Dr. Hollice Espy for chronic low back pain, right hand pain, and multiple joint pains.  She states that when she was about 9, she had a gymnastics injury to her lower back.  She fractured part of her spine.  Later on she fractured her tailbone.  She has had multiple other injuries to her lower back.  She has struggled with chronic pain since then, but this past December she was sick with COVID-19 and it caused significant aches and pains in much of her body, especially in her lower back.  She started seeing Dr. Hollice Espy for chiropractic treatments and the treatments have helped give temporary relief, but the relief has not been long-lasting.  He denies any radicular pain.  She has stiffness in her back when standing up straight, pain when bending forward.  She has pain when trying to sleep at night.  In May she had labs showing a borderline elevated sed rate.  She was diagnosed with fibromyalgia syndrome.  She has been taking gabapentin and Cymbalta for her symptoms.  In the past couple months she has had locking and catching of her fingers of the right hand, primarily the third finger but sometimes the second finger.  This happens especially at night.  She also gets intermittent tingling in those fingers.  She gets pain in stiffness in her feet, occasionally her knees.  She has not noticed a rash or redness in the joints.  She does not know her entire family history because she is adopted.               ROS:   All other systems were reviewed and are negative.  Objective: Vital Signs: There were no vitals taken for this visit.  Physical Exam:  General:  Alert and oriented, in no acute distress. Pulm:  Breathing unlabored. Psy:  Normal mood, congruent affect. Skin: No visible rash Hands: She has no synovitis today.  Positive Tinel's at the carpal tunnel on the right and positive Phalen's test.  No triggering of the fingers today, but she does have some tenderness at the A1 pulley of the third finger. Low back: She is tender near both SI joints and around the L5-S1 level.  Lower extremity strength and reflexes are normal. Knees: No effusions today. Feet: There is no swelling in the ankles or toes today.   Imaging: X-rays brought with her on CD reveal moderate L5-S1 degenerative disc disease and I believe she has some anterolisthesis of L5 on S1.  I do not see a definite pars defect.  SI joints are slightly sclerotic.   Assessment & Plan: 1.  Chronic low back pain with degenerative disc disease, cannot rule out spondylolisthesis. -We will order MRI to further evaluate. -Trial of Relafen.  2.  Multiple joint pain -We will draw additional labs to  rule out rheumatologic disease.  She also has an appointment with rheumatology later this month.  3.  Right hand numbness, possible carpal tunnel syndrome.  Question second and third trigger fingers. -Carpal tunnel night splint, we will also splint the second and third fingers in extension at night. -Nerve studies if symptoms persist.  Trigger finger injections if needed.      Procedures: No procedures performed  No notes on file     PMFS History: Patient Active Problem List   Diagnosis Date Noted  . Major depression, recurrent, chronic (HCC) 12/06/2019  . Overweight (BMI 25.0-29.9) 09/09/2019  . Attention deficit hyperactivity disorder (ADHD) 09/09/2019  . HSV (herpes simplex virus) infection 07/11/2019  . Moderate persistent  asthma 12/26/2017  . Neuropathic pain 11/15/2017  . Drug addict (HCC) 11/15/2017  . Alcoholism (HCC) 11/15/2017  . Seasonal allergies 11/15/2017  . Drug dependence, in remission (HCC) 11/15/2017   Past Medical History:  Diagnosis Date  . ADHD    Follows with Dr Kenyon Ana  . Allergic rhinitis   . Anxiety disorder    Dr Kenyon Ana  . Asthma    Follows with immunologist. On immunologic therapy.  . Depression, major, recurrent (HCC)    Dr Kenyon Ana  . Dyslexia     Family History  Adopted: Yes    Past Surgical History:  Procedure Laterality Date  . NASAL SINUS SURGERY    . TONSILLECTOMY AND ADENOIDECTOMY     Social History   Occupational History  . Not on file  Tobacco Use  . Smoking status: Former Smoker    Types: E-cigarettes  . Smokeless tobacco: Never Used  . Tobacco comment: vaping  Vaping Use  . Vaping Use: Every day  Substance and Sexual Activity  . Alcohol use: No  . Drug use: No  . Sexual activity: Yes    Birth control/protection: Pill

## 2020-04-09 ENCOUNTER — Telehealth: Payer: Self-pay | Admitting: Family Medicine

## 2020-04-09 DIAGNOSIS — M2550 Pain in unspecified joint: Secondary | ICD-10-CM

## 2020-04-09 DIAGNOSIS — R768 Other specified abnormal immunological findings in serum: Secondary | ICD-10-CM

## 2020-04-09 LAB — C-REACTIVE PROTEIN: CRP: 6.7 mg/L (ref <8.0)

## 2020-04-09 LAB — RHEUMATOID FACTOR: Rheumatoid fact SerPl-aCnc: <14 IU/mL (ref <14)

## 2020-04-09 LAB — ANTI-NUCLEAR AB-TITER (ANA TITER): ANA Titer 1: 1:80 {titer} — ABNORMAL HIGH

## 2020-04-09 LAB — CYCLIC CITRUL PEPTIDE ANTIBODY, IGG: Cyclic Citrullin Peptide Ab: <16 UNITS

## 2020-04-09 LAB — ANA: Anti Nuclear Antibody (ANA): POSITIVE — AB

## 2020-04-09 LAB — THYROID PEROXIDASE ANTIBODY: Thyroperoxidase Ab SerPl-aCnc: 2 IU/mL (ref <9)

## 2020-04-09 LAB — HLA-B27 ANTIGEN: HLA-B27 Antigen: NEGATIVE

## 2020-04-09 LAB — URIC ACID: Uric Acid, Serum: 3.8 mg/dL (ref 2.5–7.0)

## 2020-04-09 LAB — VITAMIN D 25 HYDROXY (VIT D DEFICIENCY, FRACTURES): Vit D, 25-Hydroxy: 49 ng/mL (ref 30–100)

## 2020-04-09 NOTE — Telephone Encounter (Signed)
ANA is positive indicating autoimmune disease of some sort.  Thyroid antibodies were negative/normal.  C-reactive protein, inflammation marker, is in normal range at 6.7.  Rheumatoid factor and CCP antibody test were negative.  HLA-B27 and uric acid levels were normal.  Vitamin D level is perfect.

## 2020-04-13 NOTE — Addendum Note (Signed)
Addended by: Lillia Carmel on: 04/13/2020 07:52 AM   Modules accepted: Orders

## 2020-04-16 ENCOUNTER — Other Ambulatory Visit: Payer: Self-pay | Admitting: Family Medicine

## 2020-04-16 ENCOUNTER — Telehealth: Payer: Self-pay | Admitting: Family Medicine

## 2020-04-16 NOTE — Telephone Encounter (Signed)
Forms received from [employer]. Sent to [employer].

## 2020-04-23 ENCOUNTER — Ambulatory Visit
Admission: RE | Admit: 2020-04-23 | Discharge: 2020-04-23 | Disposition: A | Discharge status: Home / Self Care | Payer: Managed Care, Other (non HMO) | Source: Ambulatory Visit | Attending: Family Medicine | Admitting: Family Medicine

## 2020-04-23 ENCOUNTER — Telehealth: Payer: Self-pay | Admitting: Family Medicine

## 2020-04-23 ENCOUNTER — Other Ambulatory Visit: Payer: Self-pay

## 2020-04-23 DIAGNOSIS — M545 Low back pain, unspecified: Secondary | ICD-10-CM

## 2020-04-23 DIAGNOSIS — M4316 Spondylolisthesis, lumbar region: Secondary | ICD-10-CM

## 2020-04-23 NOTE — Telephone Encounter (Signed)
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  No nerve impingement seen.  No indication for surgery.  If pain is not improving, could consider trying physical therapy.  If that fails to help, could contemplate cortisone injection for pain relief.

## 2020-04-27 NOTE — Addendum Note (Signed)
Addended by: Lillia Carmel on: 04/27/2020 03:03 PM   Modules accepted: Orders

## 2020-04-29 MED ORDER — BACLOFEN 10 MG PO TABS: 5.0000 mg | ORAL_TABLET | Freq: Every evening | ORAL | 3 refills | Status: DC | PRN

## 2020-04-29 NOTE — Addendum Note (Signed)
Addended by: Lillia Carmel on: 04/29/2020 08:15 AM   Modules accepted: Orders

## 2020-05-04 ENCOUNTER — Encounter: Payer: Self-pay | Admitting: Family Medicine

## 2020-08-11 ENCOUNTER — Other Ambulatory Visit: Payer: Self-pay | Admitting: Family Medicine

## 2021-01-12 ENCOUNTER — Other Ambulatory Visit: Payer: Self-pay | Admitting: Family Medicine

## 2021-02-05 IMAGING — MR MR LUMBAR SPINE W/O CM
4 of 5 series · 27 of 48 positions shown · non-contrast
Comparison: None.

CLINICAL DATA: Low back pain for over 6 weeks. History of
fibromyalgia.

EXAM:
MRI LUMBAR SPINE WITHOUT CONTRAST
TECHNIQUE: Multiplanar, multisequence MR imaging of the lumbar spine was
performed. No intravenous contrast was administered.

[Series 2: T2 · sagittal · 4.0mm · 1.09mm/px · 6 of 15 slices shown (1 of 2)]
[im 1/15]
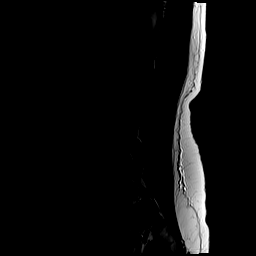
[im 3/15]
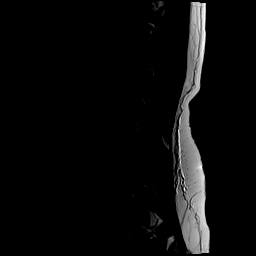
[im 6/15]
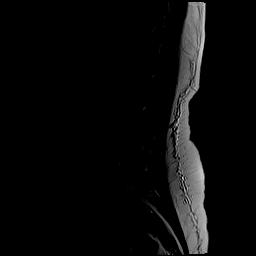
[im 9/15]
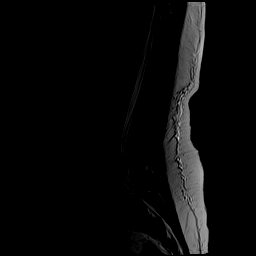
[im 12/15]
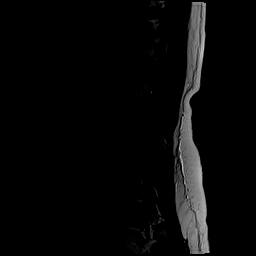
[im 15/15]
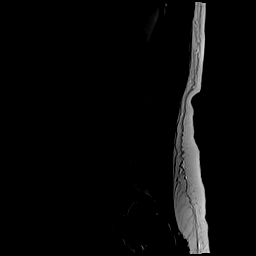

[Series 4: T1 · sagittal · 4.0mm · 1.09mm/px · 5 of 15 slices shown (1 of 2)]
[im 1/15]
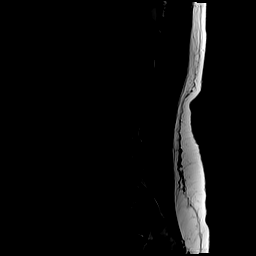
[im 4/15]
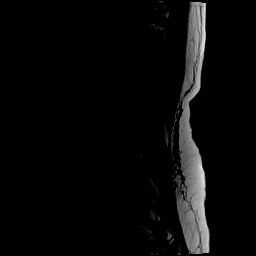
[im 8/15]
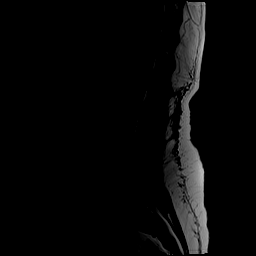
[im 11/15]
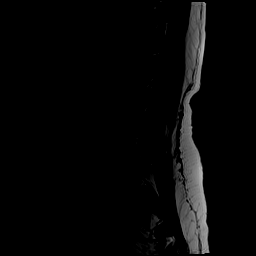
[im 15/15]
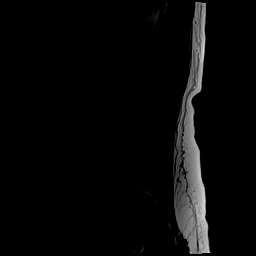

[Series 5: T2 · axial · 4.0mm · 0.39mm/px · z∈[-163,+35]mm · 10 of 43 slices shown (2 of 2)]
[im 3/43]
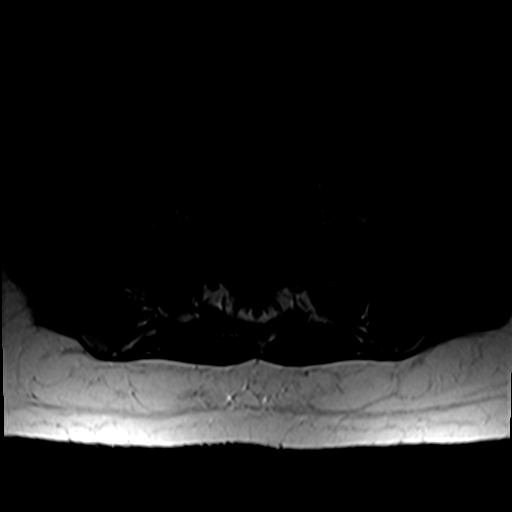
[im 6/43]
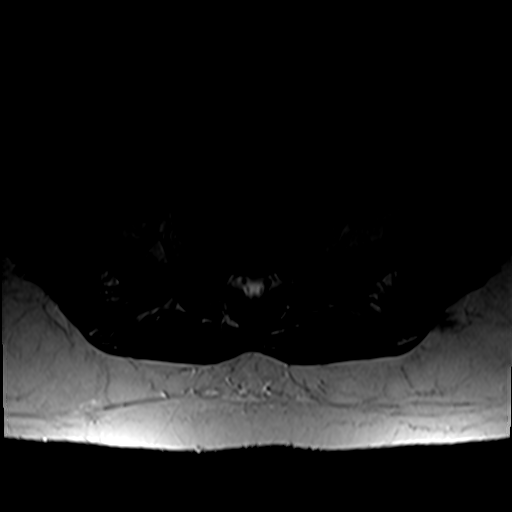
[im 9/43]
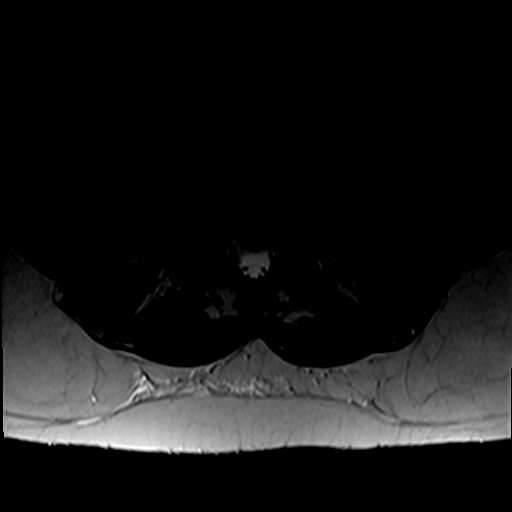
[im 15/43]
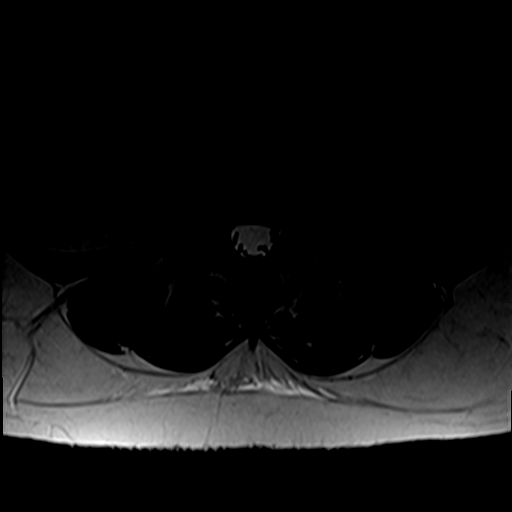
[im 20/43]
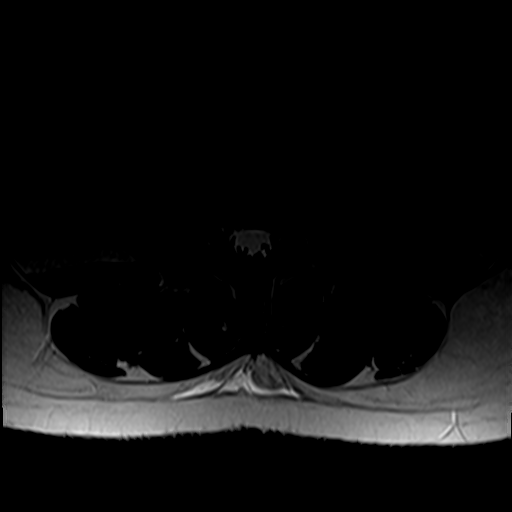
[im 23/43]
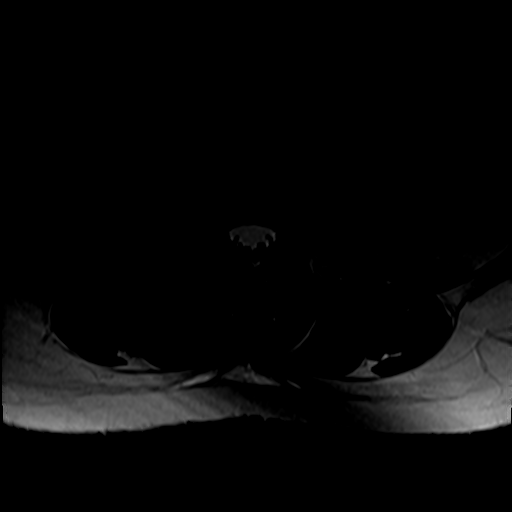
[im 26/43]
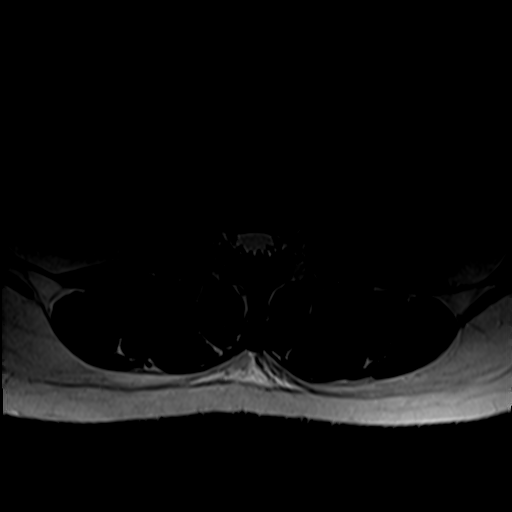
[im 31/43]
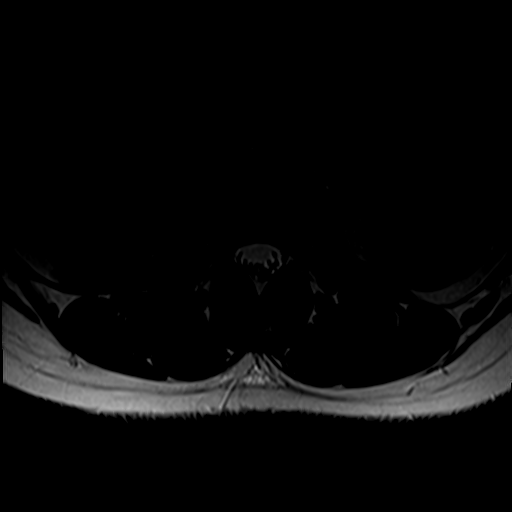
[im 37/43]
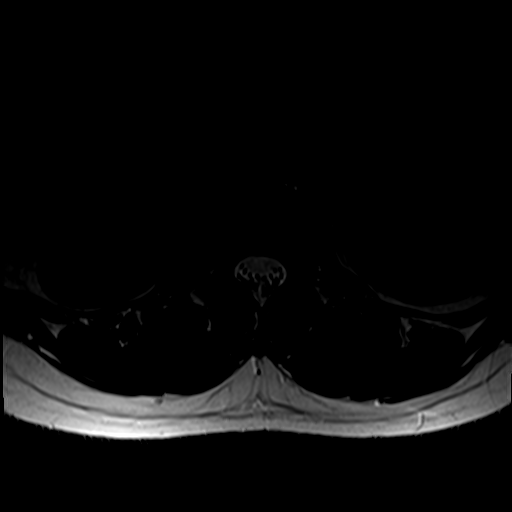
[im 43/43]
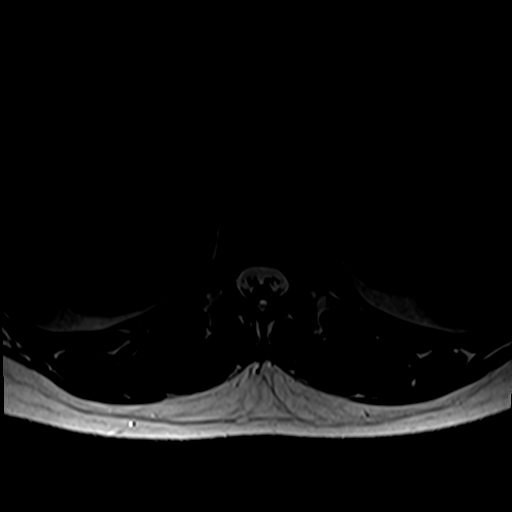

[Series 6: T1 · axial · 4.0mm · 0.39mm/px · z∈[-163,+7]mm · 6 of 43 slices shown (2 of 2)]
[im 3/43]
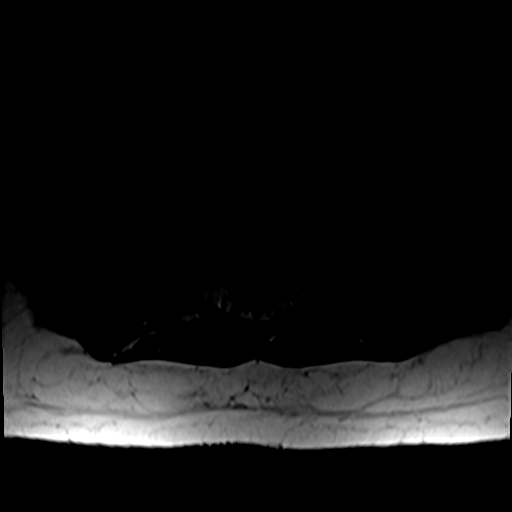
[im 6/43]
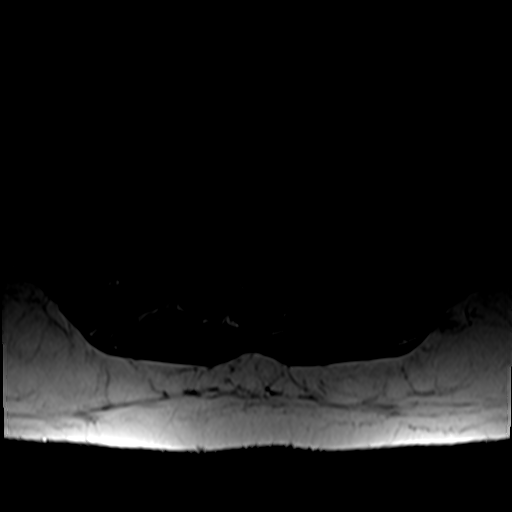
[im 9/43]
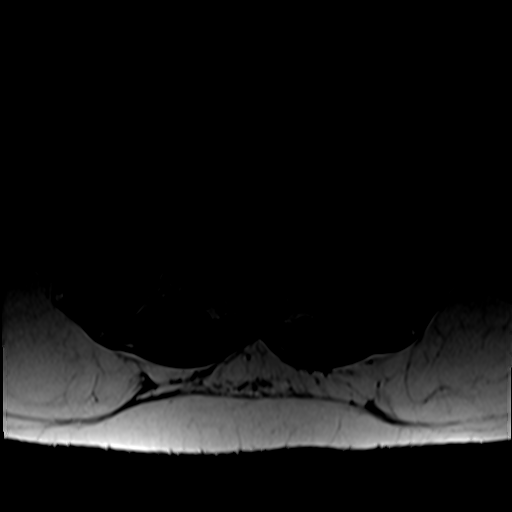
[im 15/43]
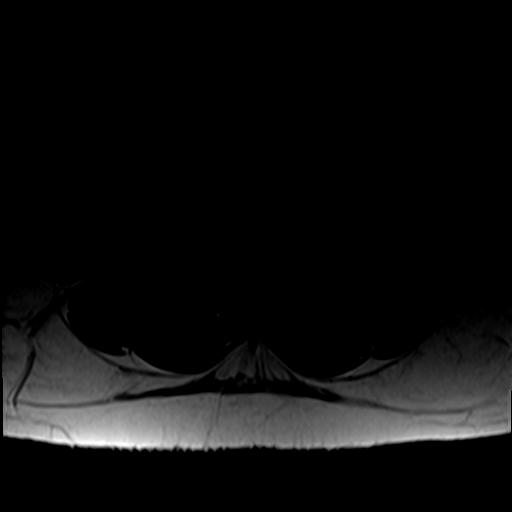
[im 23/43]
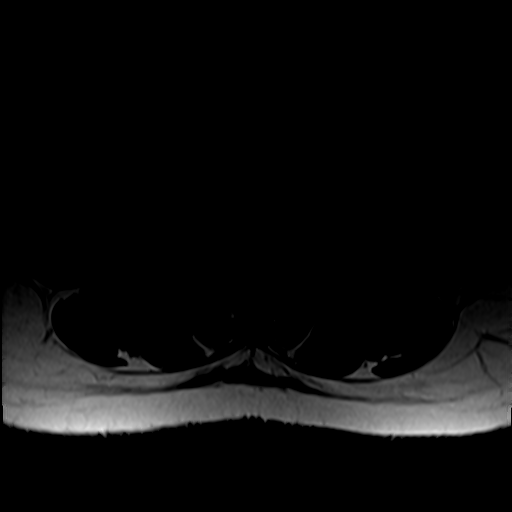
[im 37/43]
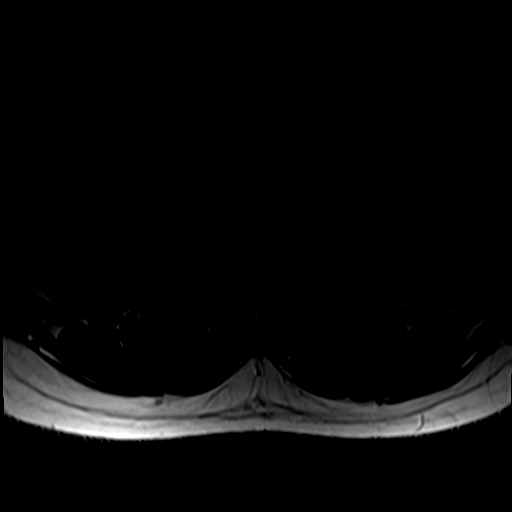

[27 of 48 positions shown; findings below may reference images not displayed]

FINDINGS: Segmentation: 5 lumbar type vertebrae based on the available
coverage

Alignment:  Grade 1 anterolisthesis at L5-S1

Vertebrae: There is a cleft at the level of the left L5 pars
interarticularis but more medially the posterior element appears
intact. No evidence of fracture, discitis, or aggressive bone lesion

Conus medullaris and cauda equina: Conus extends to the T12-L1
level. Conus and cauda equina appear normal.

Paraspinal and other soft tissues: Negative

Disc levels:

Diffusely preserved disc height and hydration. Mild facet spurring
is likely at L5-S1 and L4-5. The foramina are diffusely patent.
Subarticular recess narrowing on both sides at L5-S1 that is
accentuated by angle of imaging, not convincingly compressive.
IMPRESSION: 1. L5-S1 mild anterolisthesis with suspected underlying pars defect
on the left at least.
2. Mild marginal facet spurring at L4-5 and L5-S1.
3. L5-S1 bilateral subarticular recess crowding without definite S1
compression.
